# Patient Record
Sex: Male | Born: 1995 | Race: Black or African American | Hispanic: No | Marital: Single | Smoking: Current every day smoker
Health system: Southern US, Community
[De-identification: ages and names within clinical notes are randomized; demographics above are authoritative.]

## PROBLEM LIST (undated history)

## (undated) ENCOUNTER — Ambulatory Visit: Admission: EM | Payer: 59 | Source: Home / Self Care

## (undated) DIAGNOSIS — Z789 Other specified health status: Secondary | ICD-10-CM

## (undated) DIAGNOSIS — G4700 Insomnia, unspecified: Secondary | ICD-10-CM

## (undated) HISTORY — DX: Other specified health status: Z78.9

## (undated) HISTORY — PX: OTHER SURGICAL HISTORY: SHX169

---
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
## 2018-05-08 ENCOUNTER — Encounter (HOSPITAL_COMMUNITY): Payer: Self-pay

## 2018-05-08 ENCOUNTER — Emergency Department (HOSPITAL_COMMUNITY)
Admission: EM | Admit: 2018-05-08 | Discharge: 2018-05-08 | Disposition: A | Discharge status: Home / Self Care | Payer: Medicaid - Out of State | Attending: Emergency Medicine | Admitting: Emergency Medicine

## 2018-05-08 ENCOUNTER — Other Ambulatory Visit: Payer: Self-pay

## 2018-05-08 ENCOUNTER — Emergency Department (HOSPITAL_COMMUNITY): Payer: Medicaid - Out of State

## 2018-05-08 DIAGNOSIS — R1031 Right lower quadrant pain: Secondary | ICD-10-CM | POA: Diagnosis present

## 2018-05-08 DIAGNOSIS — E860 Dehydration: Secondary | ICD-10-CM | POA: Diagnosis not present

## 2018-05-08 DIAGNOSIS — R112 Nausea with vomiting, unspecified: Secondary | ICD-10-CM | POA: Diagnosis not present

## 2018-05-08 LAB — COMPREHENSIVE METABOLIC PANEL
ALT: 20 U/L (ref 0–44)
AST: 31 U/L (ref 15–41)
Albumin: 4.9 g/dL (ref 3.5–5.0)
Alkaline Phosphatase: 77 U/L (ref 38–126)
Anion gap: 14 (ref 5–15)
BUN: 22 mg/dL — ABNORMAL HIGH (ref 6–20)
CO2: 27 mmol/L (ref 22–32)
Calcium: 10 mg/dL (ref 8.9–10.3)
Chloride: 96 mmol/L — ABNORMAL LOW (ref 98–111)
Creatinine, Ser: 1.23 mg/dL (ref 0.61–1.24)
GFR calc Af Amer: >60 mL/min (ref >60)
GFR calc non Af Amer: >60 mL/min (ref >60)
Glucose, Bld: 113 mg/dL — ABNORMAL HIGH (ref 70–99)
Potassium: 3.1 mmol/L — ABNORMAL LOW (ref 3.5–5.1)
Sodium: 137 mmol/L (ref 135–145)
Total Bilirubin: 1.2 mg/dL (ref 0.3–1.2)
Total Protein: 8.1 g/dL (ref 6.5–8.1)

## 2018-05-08 LAB — CBC
HCT: 52 % (ref 39.0–52.0)
Hemoglobin: 17.7 g/dL — ABNORMAL HIGH (ref 13.0–17.0)
MCH: 30.3 pg (ref 26.0–34.0)
MCHC: 34 g/dL (ref 30.0–36.0)
MCV: 88.9 fL (ref 80.0–100.0)
Platelets: 243 10*3/uL (ref 150–400)
RBC: 5.85 MIL/uL — ABNORMAL HIGH (ref 4.22–5.81)
RDW: 12.4 % (ref 11.5–15.5)
WBC: 8.3 10*3/uL (ref 4.0–10.5)
nRBC: 0 % (ref 0.0–0.2)

## 2018-05-08 LAB — LIPASE, BLOOD: Lipase: 32 U/L (ref 11–51)

## 2018-05-08 MED ORDER — PROMETHAZINE HCL 25 MG PO TABS: 25.0000 mg | ORAL_TABLET | Freq: Four times a day (QID) | ORAL | 0 refills | Status: DC | PRN

## 2018-05-08 MED ORDER — IOHEXOL 300 MG/ML  SOLN
100.0000 mL | Freq: Once | INTRAMUSCULAR | Status: AC | PRN
  Administered 2018-05-08: 100 mL via INTRAVENOUS

## 2018-05-08 MED ORDER — MORPHINE SULFATE (PF) 4 MG/ML IV SOLN
4.0000 mg | Freq: Once | INTRAVENOUS | Status: AC
  Administered 2018-05-08: 4 mg via INTRAVENOUS
  Filled 2018-05-08: qty 1

## 2018-05-08 MED ORDER — METOCLOPRAMIDE HCL 5 MG/ML IJ SOLN
10.0000 mg | Freq: Once | INTRAMUSCULAR | Status: AC
  Administered 2018-05-08: 10 mg via INTRAVENOUS
  Filled 2018-05-08: qty 2

## 2018-05-08 MED ORDER — SODIUM CHLORIDE 0.9 % IV BOLUS
1000.0000 mL | Freq: Once | INTRAVENOUS | Status: AC
  Administered 2018-05-08: 1000 mL via INTRAVENOUS

## 2018-05-08 MED ORDER — POTASSIUM CHLORIDE CRYS ER 20 MEQ PO TBCR
40.0000 meq | EXTENDED_RELEASE_TABLET | Freq: Once | ORAL | Status: AC
  Administered 2018-05-08: 40 meq via ORAL
  Filled 2018-05-08: qty 2

## 2018-05-08 MED ORDER — DIPHENHYDRAMINE HCL 50 MG/ML IJ SOLN
25.0000 mg | Freq: Once | INTRAMUSCULAR | Status: AC
  Administered 2018-05-08: 25 mg via INTRAVENOUS
  Filled 2018-05-08: qty 1

## 2018-05-08 MED ORDER — SODIUM CHLORIDE 0.9% FLUSH
3.0000 mL | Freq: Once | INTRAVENOUS | Status: AC
  Administered 2018-05-08: 3 mL via INTRAVENOUS

## 2018-05-08 NOTE — ED Notes (Signed)
Patient transported to CT 

## 2018-05-08 NOTE — ED Notes (Signed)
Pt tolerated fluid challenge. No nausea noted

## 2018-05-08 NOTE — ED Triage Notes (Signed)
Pt here for nausea and emesis for the last 3 days.  Afebrile, no cough or chills.  A&Ox4.

## 2018-05-08 NOTE — ED Notes (Signed)
Pt back from CT

## 2018-05-08 NOTE — ED Notes (Signed)
Lavina Hamman (mother) : [phone]

## 2018-05-08 NOTE — ED Provider Notes (Signed)
[facility] EMERGENCY DEPARTMENT Provider Note   CSN: [id number] Arrival date & time: 05/08/18  1624    History   Chief Complaint Chief Complaint  Patient presents with  . Nausea  . Emesis    HPI Douglas Poole is a 23 y.o. male with no significant past medical history who presents today for evaluation of right lower quadrant abdominal pain, nausea, and vomiting for 4 days.  He denies any diarrhea, says that he had a bowel movement last 2 days ago however generally feels constipated.  He denies any fevers, coughs, chest pain, or shortness of breath.  He says that he has had a hard time sleeping recently and that he feels that is contributing.  He denies any recent trauma.  He has never had any abdominal surgeries before.  His pain has not radiated or moved. He reports he has vomited about 45 times today.     HPI  History reviewed. No pertinent past medical history.  Patient Active Problem List   Diagnosis Date Noted  . Acne 12/13/2012    Past Surgical History:  Procedure Laterality Date  . KNEE SURGERY  2014        Home Medications    Prior to Admission medications   Medication Sig Start Date End Date Taking? Authorizing Provider  PRESCRIPTION MEDICATION once.    [provider]  promethazine (PHENERGAN) 25 MG tablet Take 1 tablet (25 mg total) by mouth every 6 (six) hours as needed for nausea or vomiting. 05/08/18   Cristina Gong, PA-C    Family History History reviewed. No pertinent family history.  Social History Social History   Tobacco Use  . Smoking status: Never Smoker  . Smokeless tobacco: Never Used  Substance Use Topics  . Alcohol use: No  . Drug use: No     Allergies   Patient has no known allergies.   Review of Systems Review of Systems  Constitutional: Negative for chills and fever.  HENT: Negative for congestion.   Respiratory: Negative for chest tightness and shortness of breath.   Cardiovascular:  Negative for chest pain.  Gastrointestinal: Positive for abdominal pain, constipation, nausea and vomiting. Negative for abdominal distention, blood in stool and diarrhea.  Genitourinary: Negative for dysuria.  Musculoskeletal: Negative for back pain and neck pain.  Skin: Negative for color change and rash.  Neurological: Negative for weakness, light-headedness and headaches.  All other systems reviewed and are negative.    Physical Exam Updated Vital Signs BP 135/80   Pulse 74   Temp 98.9 F (37.2 C) (Oral)   Resp 16   SpO2 100%   Physical Exam Vitals signs and nursing note reviewed.  Constitutional:      Appearance: He is well-developed.     Comments: Actively vomiting on exam  HENT:     Head: Normocephalic and atraumatic.  Eyes:     Conjunctiva/sclera: Conjunctivae normal.  Neck:     Musculoskeletal: Normal range of motion and neck supple. No neck rigidity.  Cardiovascular:     Rate and Rhythm: Normal rate and regular rhythm.     Pulses: Normal pulses.     Heart sounds: Normal heart sounds. No murmur.  Pulmonary:     Effort: Pulmonary effort is normal. No respiratory distress.     Breath sounds: Normal breath sounds.  Abdominal:     General: Abdomen is flat. Bowel sounds are normal. There is no distension.     Palpations: Abdomen is soft.  Tenderness: There is abdominal tenderness. Positive signs include McBurney's sign. Negative signs include Murphy's sign.     Comments: Tenderness is worse in the right lower quadrant, is also present in right upper quadrant.  Musculoskeletal: Normal range of motion.     Right lower leg: No edema.     Left lower leg: No edema.  Skin:    General: Skin is warm and dry.  Neurological:     General: No focal deficit present.     Mental Status: He is alert and oriented to person, place, and time.  Psychiatric:        Mood and Affect: Mood normal.        Behavior: Behavior normal.      ED Treatments / Results  Labs (all labs  ordered are listed, but only abnormal results are displayed) Labs Reviewed  COMPREHENSIVE METABOLIC PANEL - Abnormal; Notable for the following components:      Result Value   Potassium 3.1 (*)    Chloride 96 (*)    Glucose, Bld 113 (*)    BUN 22 (*)    All other components within normal limits  CBC - Abnormal; Notable for the following components:   RBC 5.85 (*)    Hemoglobin 17.7 (*)    All other components within normal limits  LIPASE, BLOOD  URINALYSIS, ROUTINE W REFLEX MICROSCOPIC    EKG None  Radiology Ct Abdomen Pelvis W Contrast  Result Date: 05/08/2018 CLINICAL DATA:  Nausea and vomiting with abdominal pain EXAM: CT ABDOMEN AND PELVIS WITH CONTRAST TECHNIQUE: Multidetector CT imaging of the abdomen and pelvis was performed using the standard protocol following bolus administration of intravenous contrast. CONTRAST:  OMNIPAQUE IOHEXOL 300 MG/ML  SOLN COMPARISON:  None FINDINGS: Lower chest: Lung bases are clear. Hepatobiliary: There is focal fatty infiltration near the fissure for the ligamentum teres. No focal liver lesions are appreciable. Gallbladder wall is not appreciably thickened. There is no biliary duct dilatation. Pancreas: No pancreatic mass or inflammatory focus evident. Spleen: No splenic lesions are appreciable. Adrenals/Urinary Tract: Adrenals bilaterally appear normal. Kidneys bilaterally show no evident mass or hydronephrosis on either side. There is no renal or ureteral calculus on either side. Urinary bladder is midline with wall thickness within normal limits. Stomach/Bowel: There is no bowel wall or mesenteric thickening. There is no evident bowel obstruction. There is no free air or portal venous air. Vascular/Lymphatic: Aorta is nonaneurysmal. There is both a left and right inferior vena cava inferior to the renal veins. The left-sided inferior vena cava joins the left renal vein. The left and right renal veins insert into the right sided inferior vena  cava. The infrahepatic portion of the inferior vena cava appears normal. No obstructing lesions are evident. No adenopathy is evident in the abdomen or pelvis. Reproductive: Prostate and seminal vesicles appear normal in size and contour. No evident pelvic mass. Other: The appendix appears unremarkable. No abscess or ascites is evident in the abdomen or pelvis. Musculoskeletal: There are no blastic or lytic bone lesions. There is no intramuscular or abdominal wall lesion. IMPRESSION: 1. 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 2. Appendix appears normal. No bowel obstruction. No abscess in the abdomen or pelvis. 3, no evident renal or ureteral calculus. No hydronephrosis. Urinary bladder wall thickness within normal limits. Electronically Signed   By: Bretta Bang III M.D.   On: 05/08/2018 19:45    Procedures Procedures (including critical care time)  Medications Ordered in ED Medications  sodium chloride flush (NS) 0.9 % injection 3 mL (3 mLs Intravenous Given 05/08/18 1827)  sodium chloride 0.9 % bolus 1,000 mL (0 mLs Intravenous Stopped 05/08/18 1907)  metoCLOPramide (REGLAN) injection 10 mg (10 mg Intravenous Given 05/08/18 1824)  diphenhydrAMINE (BENADRYL) injection 25 mg (25 mg Intravenous Given 05/08/18 1825)  morphine 4 MG/ML injection 4 mg (4 mg Intravenous Given 05/08/18 1824)  iohexol (OMNIPAQUE) 300 MG/ML solution 100 mL (100 mLs Intravenous Contrast Given 05/08/18 1922)  potassium chloride SA (K-DUR,KLOR-CON) CR tablet 40 mEq (40 mEq Oral Given 05/08/18 1955)     Initial Impression / Assessment and Plan / ED Course  I have reviewed the triage vital signs and the nursing notes.  Pertinent labs & imaging results that were available during my care of the patient were reviewed by me and considered in my medical decision making (see chart for details).  Clinical Course as of May 08 2250  Mon May 08, 2018  2003 Patient reevaluated, he is  feeling much better, ready for PO challenge.    [EH]  2025 Patient reevaluated, he says that he "drank the water too fast" and is now feeling nauseous.   [EH]    Clinical Course User Index [EH] Cristina Gong, PA-C      Patient presents today for evaluation of right lower quadrant abdominal pain, nausea, and vomiting.  On exam his abdomen was tender to palpation in the right lower quadrant and he was actively vomiting.  Labs were obtained and reviewed, his hemoglobin is elevated at 17.7 consistent with dehydration.  His potassium was slightly low at 3.1, suspect that this is secondary to GI losses.  He denied any urinary symptoms, therefore UA was not obtained.  His lipase is not elevated, do not suspect pancreatitis.  CT scan abdomen pelvis was obtained without acute abnormalities or evidence of appendicitis.  He was informed of his anatomical variant inferior vena cava.  He felt much better after fluids, Reglan, Benadryl, and morphine.  He was able to p.o. challenge without additional vomiting.  Suspect gastroenteritis versus cannabinoid hyperemesis.  He does admit to frequent marijuana use.  He is advised to abstain from all marijuana use, recommended starting with a bland diet.  He is given a prescription for Phenergan as he has Zofran prescriptions and says that this does not work.    Given that I suspect his hypokalemia is from GI loss, I do not feel that prescription for continued potassium at home is indicated.  Return precautions were discussed with patient who states their understanding.  At the time of discharge patient denied any unaddressed complaints or concerns.  Patient is agreeable for discharge home.   Final Clinical Impressions(s) / ED Diagnoses   Final diagnoses:  Non-intractable vomiting with nausea, unspecified vomiting type  Dehydration    ED Discharge Orders         Ordered    promethazine (PHENERGAN) 25 MG tablet  Every 6 hours PRN     05/08/18 2131            Norman Clay 05/08/18 2254    Raeford Razor, MD 05/08/18 [phone]

## 2018-05-08 NOTE — Discharge Instructions (Addendum)
Today you received medications that may make you sleepy or impair your ability to make decisions.  For the next 24 hours please do not drive, operate heavy machinery, care for a small child with out another adult present, or perform any activities that may cause harm to you or someone else if you were to fall asleep or be impaired.   Today your blood work and CT scan were reassuring.  I suspect that your symptoms are caused either by a virus or by your marijuana use.  I would recommend that you stop using marijuana for at least 3 weeks to see if your symptoms improve.

## 2018-05-08 NOTE — ED Notes (Signed)
Patient verbalizes understanding of discharge instructions. Opportunity for questioning and answers were provided. Armband removed by staff, pt discharged from ED ambulatory.   

## 2018-05-08 NOTE — ED Notes (Signed)
Patient reports 4 days of vomiting and abdominal pain

## 2018-05-08 NOTE — ED Notes (Signed)
Pt transported to CT ?

## 2019-03-27 ENCOUNTER — Encounter: Payer: Self-pay | Admitting: Emergency Medicine

## 2019-03-27 ENCOUNTER — Ambulatory Visit
Admission: EM | Admit: 2019-03-27 | Discharge: 2019-03-27 | Disposition: A | Discharge status: Home / Self Care | Payer: 59 | Attending: Emergency Medicine | Admitting: Emergency Medicine

## 2019-03-27 ENCOUNTER — Other Ambulatory Visit: Payer: Self-pay

## 2019-03-27 DIAGNOSIS — H00012 Hordeolum externum right lower eyelid: Secondary | ICD-10-CM | POA: Diagnosis not present

## 2019-03-27 DIAGNOSIS — H00011 Hordeolum externum right upper eyelid: Secondary | ICD-10-CM | POA: Diagnosis not present

## 2019-03-27 MED ORDER — POLYMYXIN B-TRIMETHOPRIM 10000-0.1 UNIT/ML-% OP SOLN: 1.0000 [drp] | OPHTHALMIC | 0 refills | Status: AC

## 2019-03-27 NOTE — ED Provider Notes (Signed)
[facility]    CSN: [id number] Arrival date & time: 03/27/19  1331      History   Chief Complaint Chief Complaint  Patient presents with  . Eye Pain    HPI Douglas Poole is a 24 y.o. male presenting for right eye pain, swelling, irritation for the last week.  Patient states that he developed a stye on his right upper lid a week ago, another 1 is lower lid 2 days ago.  States he got dirt in his eye on the way in here, there was able to irrigate it with tap water.  Patient denies change in vision contacts or glasses use, fever, discharge, tearing.  Tried using ice packs, hot compress last night without significant relief.  Denies immunocompromise status.  History reviewed. No pertinent past medical history.  Patient Active Problem List   Diagnosis Date Noted  . Acne 12/13/2012    Past Surgical History:  Procedure Laterality Date  . KNEE SURGERY  2014       Home Medications    Prior to Admission medications   Medication Sig Start Date End Date Taking? Authorizing Provider  PRESCRIPTION MEDICATION once.    [provider]  promethazine (PHENERGAN) 25 MG tablet Take 1 tablet (25 mg total) by mouth every 6 (six) hours as needed for nausea or vomiting. 05/08/18   Lorin Glass, PA-C  trimethoprim-polymyxin b (POLYTRIM) ophthalmic solution Place 1 drop into the right eye every 4 (four) hours for 7 days. 03/27/19 04/03/19  Hall-Potvin, [place], PA-C    Family History History reviewed. No pertinent family history.  Social History Social History   Tobacco Use  . Smoking status: Never Smoker  . Smokeless tobacco: Never Used  Substance Use Topics  . Alcohol use: No  . Drug use: No     Allergies   Patient has no known allergies.   Review of Systems As per HPI   Physical Exam Triage Vital Signs ED Triage Vitals  Enc Vitals Group     BP      Pulse      Resp      Temp      Temp src      SpO2      Weight      Height      Head  Circumference      Peak Flow      Pain Score      Pain Loc      Pain Edu?      Excl. in [place]?    No data found.  Updated Vital Signs BP 134/81 (BP Location: Left Arm)   Pulse 77   Temp 98.5 F (36.9 C) (Temporal)   Resp 16   SpO2 97%   Visual Acuity Right Eye Distance:   Left Eye Distance:   Bilateral Distance:    Right Eye Near: R Near: 20/25 Left Eye Near:  L Near: 20/20 Bilateral Near:  20/20  Physical Exam Constitutional:      General: He is not in acute distress.    Appearance: Normal appearance. He is not ill-appearing.  HENT:     Head: Normocephalic and atraumatic.  Eyes:     General: Lids are normal. Lids are everted, no foreign bodies appreciated. Vision grossly intact. Gaze aligned appropriately. No visual field deficit or scleral icterus.       Right eye: Hordeolum present. No foreign body or discharge.        Left eye: No foreign  body, discharge or hordeolum.     Extraocular Movements: Extraocular movements intact.     Right eye: No nystagmus.     Left eye: No nystagmus.     Conjunctiva/sclera:     Right eye: Right conjunctiva is not injected. No chemosis, exudate or hemorrhage.    Left eye: Left conjunctiva is not injected. No chemosis, exudate or hemorrhage.    Pupils: Pupils are equal, round, and reactive to light.   Cardiovascular:     Rate and Rhythm: Normal rate.  Pulmonary:     Effort: Pulmonary effort is normal.     Breath sounds: No wheezing.  Musculoskeletal:     Cervical back: Neck supple. No tenderness.  Lymphadenopathy:     Cervical: No cervical adenopathy.  Skin:    Findings: No bruising, erythema or rash.      UC Treatments / Results  Labs (all labs ordered are listed, but only abnormal results are displayed) Labs Reviewed - No data to display  EKG   Radiology No results found.  Procedures Procedures (including critical care time)  Medications Ordered in UC Medications - No data to display  Initial Impression /  Assessment and Plan / UC Course  I have reviewed the triage vital signs and the nursing notes.  Pertinent labs & imaging results that were available during my care of the patient were reviewed by me and considered in my medical decision making (see chart for details).     Patient afebrile, without severe orbital pain, or change in vision.  No corneal abrasion identified on exam.  Will start Polytrim drops, hot compresses, follow-up with ophthalmology in 1 week.  Return precautions discussed, patient verbalized understanding and is agreeable to plan. Final Clinical Impressions(s) / UC Diagnoses   Final diagnoses:  Hordeolum externum of right upper eyelid  Hordeolum externum of right lower eyelid     Discharge Instructions     Use antibiotic eyedrops as directed. Use hot compress for 5-10 minutes, 3-5 times a day. Follow up w/ eye doc in 1 week: sooner if change in vision, eye pain, fever.    ED Prescriptions    Medication Sig Dispense Auth. Provider   trimethoprim-polymyxin b (POLYTRIM) ophthalmic solution Place 1 drop into the right eye every 4 (four) hours for 7 days. 10 mL Hall-Potvin, [place], PA-C     PDMP not reviewed this encounter.   Hall-Potvin, [place], [place] 03/27/19 1853

## 2019-03-27 NOTE — ED Triage Notes (Signed)
Pt presents to [facility] for assessment of two styes to right eye and then today got dirt in the right eye.  Redness, swelling and discomfort noted.

## 2019-03-27 NOTE — Discharge Instructions (Addendum)
Use antibiotic eyedrops as directed. Use hot compress for 5-10 minutes, 3-5 times a day. Follow up w/ eye doc in 1 week: sooner if change in vision, eye pain, fever.

## 2019-06-14 ENCOUNTER — Emergency Department (HOSPITAL_COMMUNITY)
Admission: EM | Admit: 2019-06-14 | Discharge: 2019-06-14 | Disposition: A | Discharge status: Home / Self Care | Payer: 59 | Attending: Emergency Medicine | Admitting: Emergency Medicine

## 2019-06-14 ENCOUNTER — Encounter (HOSPITAL_COMMUNITY): Payer: Self-pay | Admitting: Emergency Medicine

## 2019-06-14 ENCOUNTER — Other Ambulatory Visit: Payer: Self-pay

## 2019-06-14 DIAGNOSIS — R197 Diarrhea, unspecified: Secondary | ICD-10-CM | POA: Insufficient documentation

## 2019-06-14 DIAGNOSIS — R10817 Generalized abdominal tenderness: Secondary | ICD-10-CM | POA: Insufficient documentation

## 2019-06-14 DIAGNOSIS — K920 Hematemesis: Secondary | ICD-10-CM | POA: Diagnosis not present

## 2019-06-14 DIAGNOSIS — R1013 Epigastric pain: Secondary | ICD-10-CM | POA: Insufficient documentation

## 2019-06-14 DIAGNOSIS — R110 Nausea: Secondary | ICD-10-CM | POA: Insufficient documentation

## 2019-06-14 LAB — CBC
HCT: 48.2 % (ref 39.0–52.0)
Hemoglobin: 16.5 g/dL (ref 13.0–17.0)
MCH: 30.9 pg (ref 26.0–34.0)
MCHC: 34.2 g/dL (ref 30.0–36.0)
MCV: 90.3 fL (ref 80.0–100.0)
Platelets: 308 10*3/uL (ref 150–400)
RBC: 5.34 MIL/uL (ref 4.22–5.81)
RDW: 12.5 % (ref 11.5–15.5)
WBC: 7.8 10*3/uL (ref 4.0–10.5)
nRBC: 0 % (ref 0.0–0.2)

## 2019-06-14 LAB — COMPREHENSIVE METABOLIC PANEL
ALT: 25 U/L (ref 0–44)
AST: 37 U/L (ref 15–41)
Albumin: 4.4 g/dL (ref 3.5–5.0)
Alkaline Phosphatase: 86 U/L (ref 38–126)
Anion gap: 10 (ref 5–15)
BUN: 17 mg/dL (ref 6–20)
CO2: 27 mmol/L (ref 22–32)
Calcium: 9.5 mg/dL (ref 8.9–10.3)
Chloride: 104 mmol/L (ref 98–111)
Creatinine, Ser: 1.12 mg/dL (ref 0.61–1.24)
GFR calc Af Amer: >60 mL/min (ref >60)
GFR calc non Af Amer: >60 mL/min (ref >60)
Glucose, Bld: 108 mg/dL — ABNORMAL HIGH (ref 70–99)
Potassium: 3.4 mmol/L — ABNORMAL LOW (ref 3.5–5.1)
Sodium: 141 mmol/L (ref 135–145)
Total Bilirubin: 1.1 mg/dL (ref 0.3–1.2)
Total Protein: 7.8 g/dL (ref 6.5–8.1)

## 2019-06-14 LAB — LIPASE, BLOOD: Lipase: 25 U/L (ref 11–51)

## 2019-06-14 MED ORDER — ONDANSETRON HCL 4 MG/2ML IJ SOLN
4.0000 mg | Freq: Once | INTRAMUSCULAR | Status: AC
  Administered 2019-06-14: 4 mg via INTRAVENOUS
  Filled 2019-06-14: qty 2

## 2019-06-14 MED ORDER — ALUM & MAG HYDROXIDE-SIMETH 200-200-20 MG/5ML PO SUSP
30.0000 mL | Freq: Once | ORAL | Status: AC
  Administered 2019-06-14: 30 mL via ORAL
  Filled 2019-06-14: qty 30

## 2019-06-14 MED ORDER — LIDOCAINE VISCOUS HCL 2 % MT SOLN
15.0000 mL | Freq: Once | OROMUCOSAL | Status: AC
  Administered 2019-06-14: 16:00:00 15 mL via ORAL
  Filled 2019-06-14: qty 15

## 2019-06-14 MED ORDER — FAMOTIDINE IN NACL 20-0.9 MG/50ML-% IV SOLN
20.0000 mg | Freq: Once | INTRAVENOUS | Status: AC
  Administered 2019-06-14: 20 mg via INTRAVENOUS
  Filled 2019-06-14: qty 50

## 2019-06-14 MED ORDER — LORAZEPAM 2 MG/ML IJ SOLN
1.0000 mg | Freq: Once | INTRAMUSCULAR | Status: AC
  Administered 2019-06-14: 1 mg via INTRAVENOUS
  Filled 2019-06-14: qty 1

## 2019-06-14 MED ORDER — MORPHINE SULFATE (PF) 4 MG/ML IV SOLN
4.0000 mg | Freq: Once | INTRAVENOUS | Status: AC
  Administered 2019-06-14: 4 mg via INTRAVENOUS
  Filled 2019-06-14: qty 1

## 2019-06-14 MED ORDER — PANTOPRAZOLE SODIUM 20 MG PO TBEC: 20.0000 mg | DELAYED_RELEASE_TABLET | Freq: Every day | ORAL | 0 refills | Status: DC

## 2019-06-14 MED ORDER — LACTATED RINGERS IV BOLUS
1000.0000 mL | Freq: Once | INTRAVENOUS | Status: AC
  Administered 2019-06-14: 1000 mL via INTRAVENOUS

## 2019-06-14 NOTE — Discharge Instructions (Signed)
Please avoid eating anything heavy, spicy, or fatty until you symptoms are improving Take Zofran as needed for nausea/vomiting Take Protonix once daily for acid suppression Avoid alcohol and NSAIDs (Ibuprofen or Naproxen) Follow up with GI doctor

## 2019-06-14 NOTE — ED Provider Notes (Signed)
[facility] EMERGENCY DEPARTMENT Provider Note   CSN: [id number] Arrival date & time: 06/14/19  1155     History Chief Complaint  Patient presents with  . Emesis  . Abdominal Pain    Douglas Poole is a 24 y.o. male with history of acne who presents with abdominal pain, N/V since Monday. He drank heavily on Sunday and the next day started to have abdominal pain with multiple episodes of vomiting. Emesis was initially food contents and became coffee ground emesis. He reports up to 10 episodes daily. Abdominal pain is epigastric and periumbilical and radiates to the left side of the abdomen. He reports associated non-bloody diarrhea. He has had similar symptoms in the past with heavy ETOH use but has never had hematemesis. He tried Pepcid with mild, temporarily relief. He denies fever, syncope, chest pain, SOB, urinary symptoms. He does not take NSAIDs. He was on Accutane but has stopped this med last month. He has only been able to tolerate ice.  HPI     History reviewed. No pertinent past medical history.  Patient Active Problem List   Diagnosis Date Noted  . Acne 12/13/2012    Past Surgical History:  Procedure Laterality Date  . KNEE SURGERY  2014       No family history on file.  Social History   Tobacco Use  . Smoking status: Never Smoker  . Smokeless tobacco: Never Used  Substance Use Topics  . Alcohol use: No  . Drug use: No    Home Medications Prior to Admission medications   Medication Sig Start Date End Date Taking? Authorizing Provider  PRESCRIPTION MEDICATION once.    [provider]  promethazine (PHENERGAN) 25 MG tablet Take 1 tablet (25 mg total) by mouth every 6 (six) hours as needed for nausea or vomiting. 05/08/18   Cristina Gong, PA-C    Allergies    Patient has no known allergies.  Review of Systems   Review of Systems  Constitutional: Negative for chills and fever.  Respiratory: Negative for shortness  of breath.   Cardiovascular: Negative for chest pain.  Gastrointestinal: Positive for abdominal pain, diarrhea, nausea and vomiting. Negative for blood in stool.       +coffee ground emesis  Genitourinary: Negative for dysuria and flank pain.  All other systems reviewed and are negative.   Physical Exam Updated Vital Signs BP (!) 106/95 (BP Location: Left Arm)   Pulse 83   Temp 98.2 F (36.8 C) (Oral)   Resp 14   Ht 6\' 2"  (1.88 m)   SpO2 96%   Physical Exam Vitals and nursing note reviewed.  Constitutional:      General: He is not in acute distress.    Appearance: Normal appearance. He is well-developed. He is not ill-appearing.     Comments: Calm and cooperative. NAD  HENT:     Head: Normocephalic and atraumatic.  Eyes:     General: No scleral icterus.       Right eye: No discharge.        Left eye: No discharge.     Conjunctiva/sclera: Conjunctivae normal.     Pupils: Pupils are equal, round, and reactive to light.  Cardiovascular:     Rate and Rhythm: Normal rate and regular rhythm.  Pulmonary:     Effort: Pulmonary effort is normal. No respiratory distress.     Breath sounds: Normal breath sounds.  Abdominal:     General: There is no distension.  Palpations: Abdomen is soft.     Tenderness: There is abdominal tenderness (generalized, worse in the epigastric area).  Musculoskeletal:     Cervical back: Normal range of motion.  Skin:    General: Skin is warm and dry.  Neurological:     Mental Status: He is alert and oriented to person, place, and time.  Psychiatric:        Behavior: Behavior normal.     ED Results / Procedures / Treatments   Labs (all labs ordered are listed, but only abnormal results are displayed) Labs Reviewed  COMPREHENSIVE METABOLIC PANEL - Abnormal; Notable for the following components:      Result Value   Potassium 3.4 (*)    Glucose, Bld 108 (*)    All other components within normal limits  LIPASE, BLOOD  CBC     EKG None  Radiology No results found.  Procedures Procedures (including critical care time)  Medications Ordered in ED Medications  lactated ringers bolus 1,000 mL (0 mLs Intravenous Stopped 06/14/19 1619)  ondansetron (ZOFRAN) injection 4 mg (4 mg Intravenous Given 06/14/19 1441)  famotidine (PEPCID) IVPB 20 mg premix (0 mg Intravenous Stopped 06/14/19 1619)  morphine 4 MG/ML injection 4 mg (4 mg Intravenous Given 06/14/19 1622)  alum & mag hydroxide-simeth (MAALOX/MYLANTA) 200-200-20 MG/5ML suspension 30 mL (30 mLs Oral Given 06/14/19 1623)    And  lidocaine (XYLOCAINE) 2 % viscous mouth solution 15 mL (15 mLs Oral Given 06/14/19 1623)  LORazepam (ATIVAN) injection 1 mg (1 mg Intravenous Given 06/14/19 1619)    ED Course  I have reviewed the triage vital signs and the nursing notes.  Pertinent labs & imaging results that were available during my care of the patient were reviewed by me and considered in my medical decision making (see chart for details).  24 year old male presents with N/V and upper abdominal pain after drinking heavily. He reports hematemesis as well. He did vomit while in the ED and it appeared to be mostly bilious with a few black specks. His vitals are normal. He is well appearing. His labs were reassuring. Imaging not indicated today due to non-tender abdomen, normal labs and vitals, and well appearance. He was given symptomatic control and tolerated PO. Rx for Zofran and Protonix given and he was given GI f/u.  MDM Rules/Calculators/A&P                       Final Clinical Impression(s) / ED Diagnoses Final diagnoses:  Epigastric pain  Hematemesis with nausea    Rx / DC Orders ED Discharge Orders    None       Recardo Evangelist, PA-C 06/15/19 1229    Pattricia Boss, MD 06/15/19 [phone]

## 2019-06-14 NOTE — ED Triage Notes (Signed)
Pt reports drinking alcohol on Sunday and has been throwing up blood since Monday. Endorses mid abd pain.

## 2019-11-08 ENCOUNTER — Encounter (HOSPITAL_COMMUNITY): Payer: Self-pay | Admitting: Emergency Medicine

## 2019-11-08 ENCOUNTER — Emergency Department (HOSPITAL_COMMUNITY)
Admission: EM | Admit: 2019-11-08 | Discharge: 2019-11-09 | Disposition: A | Discharge status: Home / Self Care | Payer: Medicaid - Out of State | Attending: Emergency Medicine | Admitting: Emergency Medicine

## 2019-11-08 ENCOUNTER — Other Ambulatory Visit: Payer: Self-pay

## 2019-11-08 DIAGNOSIS — U071 COVID-19: Secondary | ICD-10-CM | POA: Insufficient documentation

## 2019-11-08 DIAGNOSIS — Z5321 Procedure and treatment not carried out due to patient leaving prior to being seen by health care provider: Secondary | ICD-10-CM | POA: Diagnosis not present

## 2019-11-08 DIAGNOSIS — R0602 Shortness of breath: Secondary | ICD-10-CM | POA: Diagnosis present

## 2019-11-08 LAB — CBC
HCT: 48.7 % (ref 39.0–52.0)
Hemoglobin: 16.9 g/dL (ref 13.0–17.0)
MCH: 31.5 pg (ref 26.0–34.0)
MCHC: 34.7 g/dL (ref 30.0–36.0)
MCV: 90.9 fL (ref 80.0–100.0)
Platelets: 254 10*3/uL (ref 150–400)
RBC: 5.36 MIL/uL (ref 4.22–5.81)
RDW: 12.7 % (ref 11.5–15.5)
WBC: 8.4 10*3/uL (ref 4.0–10.5)
nRBC: 0 % (ref 0.0–0.2)

## 2019-11-08 LAB — COMPREHENSIVE METABOLIC PANEL
ALT: 18 U/L (ref 0–44)
AST: 29 U/L (ref 15–41)
Albumin: 5 g/dL (ref 3.5–5.0)
Alkaline Phosphatase: 84 U/L (ref 38–126)
Anion gap: 18 — ABNORMAL HIGH (ref 5–15)
BUN: 15 mg/dL (ref 6–20)
CO2: 21 mmol/L — ABNORMAL LOW (ref 22–32)
Calcium: 10.3 mg/dL (ref 8.9–10.3)
Chloride: 102 mmol/L (ref 98–111)
Creatinine, Ser: 1.13 mg/dL (ref 0.61–1.24)
GFR calc Af Amer: >60 mL/min (ref >60)
GFR calc non Af Amer: >60 mL/min (ref >60)
Glucose, Bld: 129 mg/dL — ABNORMAL HIGH (ref 70–99)
Potassium: 3.5 mmol/L (ref 3.5–5.1)
Sodium: 141 mmol/L (ref 135–145)
Total Bilirubin: 0.8 mg/dL (ref 0.3–1.2)
Total Protein: 8.2 g/dL — ABNORMAL HIGH (ref 6.5–8.1)

## 2019-11-08 LAB — LIPASE, BLOOD: Lipase: 23 U/L (ref 11–51)

## 2019-11-08 MED ORDER — ONDANSETRON 4 MG PO TBDP
4.0000 mg | ORAL_TABLET | Freq: Once | ORAL | Status: AC | PRN
  Administered 2019-11-08: 4 mg via ORAL
  Filled 2019-11-08: qty 1

## 2019-11-08 NOTE — ED Triage Notes (Signed)
Pt reports N/V/D, SOB, weakness and fatigue X2 days.

## 2019-11-09 ENCOUNTER — Telehealth (HOSPITAL_COMMUNITY): Payer: Self-pay

## 2019-11-09 LAB — RESPIRATORY PANEL BY RT PCR (FLU A&B, COVID)
Influenza A by PCR: NEGATIVE
Influenza B by PCR: NEGATIVE
SARS Coronavirus 2 by RT PCR: POSITIVE — AB

## 2019-11-09 NOTE — ED Notes (Signed)
Pt leaving AMA. Advised to return if symptoms worsen. 

## 2019-11-10 ENCOUNTER — Other Ambulatory Visit (HOSPITAL_COMMUNITY): Payer: Self-pay | Admitting: Adult Health

## 2019-11-10 ENCOUNTER — Encounter (HOSPITAL_COMMUNITY): Payer: Self-pay | Admitting: Emergency Medicine

## 2019-11-10 ENCOUNTER — Emergency Department (HOSPITAL_COMMUNITY)
Admission: EM | Admit: 2019-11-10 | Discharge: 2019-11-10 | Disposition: A | Discharge status: Home / Self Care | Payer: HRSA Program | Attending: Emergency Medicine | Admitting: Emergency Medicine

## 2019-11-10 DIAGNOSIS — E860 Dehydration: Secondary | ICD-10-CM | POA: Diagnosis not present

## 2019-11-10 DIAGNOSIS — U071 COVID-19: Secondary | ICD-10-CM | POA: Diagnosis not present

## 2019-11-10 DIAGNOSIS — R112 Nausea with vomiting, unspecified: Secondary | ICD-10-CM | POA: Diagnosis present

## 2019-11-10 LAB — COMPREHENSIVE METABOLIC PANEL
ALT: 22 U/L (ref 0–44)
AST: 46 U/L — ABNORMAL HIGH (ref 15–41)
Albumin: 5.3 g/dL — ABNORMAL HIGH (ref 3.5–5.0)
Alkaline Phosphatase: 76 U/L (ref 38–126)
Anion gap: 20 — ABNORMAL HIGH (ref 5–15)
BUN: 42 mg/dL — ABNORMAL HIGH (ref 6–20)
CO2: 28 mmol/L (ref 22–32)
Calcium: 10.2 mg/dL (ref 8.9–10.3)
Chloride: 93 mmol/L — ABNORMAL LOW (ref 98–111)
Creatinine, Ser: 1.75 mg/dL — ABNORMAL HIGH (ref 0.61–1.24)
GFR calc Af Amer: >60 mL/min (ref >60)
GFR calc non Af Amer: 54 mL/min — ABNORMAL LOW (ref >60)
Glucose, Bld: 124 mg/dL — ABNORMAL HIGH (ref 70–99)
Potassium: 3.4 mmol/L — ABNORMAL LOW (ref 3.5–5.1)
Sodium: 141 mmol/L (ref 135–145)
Total Bilirubin: 0.8 mg/dL (ref 0.3–1.2)
Total Protein: 8.7 g/dL — ABNORMAL HIGH (ref 6.5–8.1)

## 2019-11-10 LAB — CBC WITH DIFFERENTIAL/PLATELET
Abs Immature Granulocytes: 0.02 10*3/uL (ref 0.00–0.07)
Basophils Absolute: 0 10*3/uL (ref 0.0–0.1)
Basophils Relative: 0 %
Eosinophils Absolute: 0 10*3/uL (ref 0.0–0.5)
Eosinophils Relative: 0 %
HCT: 55.2 % — ABNORMAL HIGH (ref 39.0–52.0)
Hemoglobin: 19 g/dL — ABNORMAL HIGH (ref 13.0–17.0)
Immature Granulocytes: 0 %
Lymphocytes Relative: 12 %
Lymphs Abs: 0.9 10*3/uL (ref 0.7–4.0)
MCH: 31.3 pg (ref 26.0–34.0)
MCHC: 34.4 g/dL (ref 30.0–36.0)
MCV: 90.8 fL (ref 80.0–100.0)
Monocytes Absolute: 1.3 10*3/uL — ABNORMAL HIGH (ref 0.1–1.0)
Monocytes Relative: 16 %
Neutro Abs: 5.8 10*3/uL (ref 1.7–7.7)
Neutrophils Relative %: 72 %
Platelets: 249 10*3/uL (ref 150–400)
RBC: 6.08 MIL/uL — ABNORMAL HIGH (ref 4.22–5.81)
RDW: 12.9 % (ref 11.5–15.5)
WBC: 8 10*3/uL (ref 4.0–10.5)
nRBC: 0 % (ref 0.0–0.2)

## 2019-11-10 MED ORDER — ONDANSETRON 4 MG PO TBDP: 4.0000 mg | ORAL_TABLET | Freq: Three times a day (TID) | ORAL | 0 refills | Status: DC | PRN

## 2019-11-10 MED ORDER — SODIUM CHLORIDE 0.9 % IV BOLUS
1000.0000 mL | Freq: Once | INTRAVENOUS | Status: AC
  Administered 2019-11-10: 1000 mL via INTRAVENOUS

## 2019-11-10 MED ORDER — ONDANSETRON 4 MG PO TBDP
4.0000 mg | ORAL_TABLET | Freq: Once | ORAL | Status: AC
  Administered 2019-11-10: 4 mg via ORAL
  Filled 2019-11-10: qty 1

## 2019-11-10 MED ORDER — PROMETHAZINE HCL 25 MG/ML IJ SOLN
12.5000 mg | Freq: Once | INTRAMUSCULAR | Status: AC
  Administered 2019-11-10: 12.5 mg via INTRAVENOUS
  Filled 2019-11-10: qty 1

## 2019-11-10 MED ORDER — ACETAMINOPHEN 500 MG PO TABS
1000.0000 mg | ORAL_TABLET | Freq: Once | ORAL | Status: AC
  Administered 2019-11-10: 1000 mg via ORAL
  Filled 2019-11-10: qty 2

## 2019-11-10 MED ORDER — FENTANYL CITRATE (PF) 100 MCG/2ML IJ SOLN
50.0000 ug | Freq: Once | INTRAMUSCULAR | Status: AC
  Administered 2019-11-10: 50 ug via INTRAVENOUS
  Filled 2019-11-10: qty 2

## 2019-11-10 NOTE — ED Notes (Signed)
Pt given gingerale for PO challenge 

## 2019-11-10 NOTE — Progress Notes (Signed)
I connected by phone with Bosie Helper on 11/10/2019 at 3:03 PM to discuss the potential use of a new treatment for mild to moderate COVID-19 viral infection in non-hospitalized patients.  This patient is a 24 y.o. male that meets the FDA criteria for Emergency Use Authorization of COVID monoclonal antibody casirivimab/imdevimab or bamlanivimab/eteseviamb.  Has a (+) direct SARS-CoV-2 viral test result  Has mild or moderate COVID-19   Is NOT hospitalized due to COVID-19  Is within 10 days of symptom onset  Has at least one of the high risk factor(s) for progression to severe COVID-19 and/or hospitalization as defined in EUA.  Specific high risk criteria : Other high risk medical condition per CDC:  social risk/demographic   I have spoken and communicated the following to the patient or parent/caregiver regarding COVID monoclonal antibody treatment:  1. FDA has authorized the emergency use for the treatment of mild to moderate COVID-19 in adults and pediatric patients with positive results of direct SARS-CoV-2 viral testing who are 9 years of age and older weighing at least 40 kg, and who are at high risk for progressing to severe COVID-19 and/or hospitalization.  2. The significant known and potential risks and benefits of COVID monoclonal antibody, and the extent to which such potential risks and benefits are unknown.  3. Information on available alternative treatments and the risks and benefits of those alternatives, including clinical trials.  4. Patients treated with COVID monoclonal antibody should continue to self-isolate and use infection control measures (e.g., wear mask, isolate, social distance, avoid sharing personal items, clean and disinfect "high touch" surfaces, and frequent handwashing) according to CDC guidelines.   5. The patient or parent/caregiver has the option to accept or refuse COVID monoclonal antibody treatment.  After reviewing this information with the  patient, the patient has agreed to receive one of the available covid 19 monoclonal antibodies and will be provided an appropriate fact sheet prior to infusion. Noreene Filbert, NP 11/10/2019 3:03 PM

## 2019-11-10 NOTE — ED Triage Notes (Signed)
Pt. Stated, I started not being able to sleep 4 day ago with a headache with N/V for 4 days

## 2019-11-10 NOTE — ED Triage Notes (Signed)
Pt. Here 2 days ago for the same and did not wait. Had labs done.

## 2019-11-10 NOTE — ED Provider Notes (Signed)
[facility] EMERGENCY DEPARTMENT Provider Note   CSN: [id number] Arrival date & time: 11/10/19  2841     History Chief Complaint  Patient presents with  . Insomnia  . Headache  . Nausea  . Emesis    Douglas Poole is a 24 y.o. male.  I presents to ER with concern for nausea, vomiting, headache.  Patient reports that he has had difficulty sleeping over the past few days, generalized fatigue and nausea.  Over the last day he has had approximately 6 episodes of nonbloody nonbilious emesis.  Has had some generalized abdominal discomfort.  Took some Motrin at home with minimal relief.  Has felt feverish.  Has not checked temperature.  Patient presented to ER on 9/23, basic labs were drawn, Covid test sent.  Patient left without being seen.  Covid test was positive.  Patient unaware of result.  Per review, someone tried contacting him with no answer.  HPI     History reviewed. No pertinent past medical history.  Patient Active Problem List   Diagnosis Date Noted  . Acne 12/13/2012    Past Surgical History:  Procedure Laterality Date  . KNEE SURGERY  2014       No family history on file.  Social History   Tobacco Use  . Smoking status: Never Smoker  . Smokeless tobacco: Never Used  Vaping Use  . Vaping Use: Every day  Substance Use Topics  . Alcohol use: No  . Drug use: No    Home Medications Prior to Admission medications   Medication Sig Start Date End Date Taking? Authorizing Provider  pantoprazole (PROTONIX) 20 MG tablet Take 1 tablet (20 mg total) by mouth daily. 06/14/19   Bethel Born, PA-C  PRESCRIPTION MEDICATION once.    [provider]  promethazine (PHENERGAN) 25 MG tablet Take 1 tablet (25 mg total) by mouth every 6 (six) hours as needed for nausea or vomiting. 05/08/18   Cristina Gong, PA-C    Allergies    Patient has no known allergies.  Review of Systems   Review of Systems  Constitutional: Positive  for chills, fatigue and fever.  HENT: Negative for ear pain and sore throat.   Eyes: Negative for pain and visual disturbance.  Respiratory: Negative for cough and shortness of breath.   Cardiovascular: Negative for chest pain and palpitations.  Gastrointestinal: Positive for abdominal pain, nausea and vomiting.  Genitourinary: Negative for dysuria and hematuria.  Musculoskeletal: Negative for arthralgias and back pain.  Skin: Negative for color change and rash.  Neurological: Negative for seizures and syncope.  All other systems reviewed and are negative.   Physical Exam Updated Vital Signs BP (!) 144/104   Pulse 97   Temp 99.3 F (37.4 C) (Oral)   Resp 18   Ht 6\' 2"  (1.88 m)   Wt 72.6 kg   SpO2 97%   BMI 20.54 kg/m   Physical Exam Vitals and nursing note reviewed.  Constitutional:      Appearance: He is well-developed.  HENT:     Head: Normocephalic and atraumatic.  Eyes:     Conjunctiva/sclera: Conjunctivae normal.  Cardiovascular:     Rate and Rhythm: Normal rate and regular rhythm.     Heart sounds: No murmur heard.   Pulmonary:     Effort: Pulmonary effort is normal. No respiratory distress.     Breath sounds: Normal breath sounds.  Abdominal:     Palpations: Abdomen is soft.  Tenderness: There is no abdominal tenderness.     Comments: Generalized tenderness, no focal tenderness  Musculoskeletal:     Cervical back: Neck supple.  Skin:    General: Skin is warm and dry.  Neurological:     Mental Status: He is alert.     ED Results / Procedures / Treatments   Labs (all labs ordered are listed, but only abnormal results are displayed) Labs Reviewed  COMPREHENSIVE METABOLIC PANEL - Abnormal; Notable for the following components:      Result Value   Potassium 3.4 (*)    Chloride 93 (*)    Glucose, Bld 124 (*)    BUN 42 (*)    Creatinine, Ser 1.75 (*)    Total Protein 8.7 (*)    Albumin 5.3 (*)    AST 46 (*)    GFR calc non Af Amer 54 (*)     Anion gap 20 (*)    All other components within normal limits  CBC WITH DIFFERENTIAL/PLATELET - Abnormal; Notable for the following components:   RBC 6.08 (*)    Hemoglobin 19.0 (*)    HCT 55.2 (*)    Monocytes Absolute 1.3 (*)    All other components within normal limits    EKG None  Radiology No results found.  Procedures Procedures (including critical care time)  Medications Ordered in ED Medications  sodium chloride 0.9 % bolus 1,000 mL (has no administration in time range)  ondansetron (ZOFRAN-ODT) disintegrating tablet 4 mg (4 mg Oral Given 11/10/19 0728)  sodium chloride 0.9 % bolus 1,000 mL (1,000 mLs Intravenous New Bag/Given (Non-Interop) 11/10/19 1046)  promethazine (PHENERGAN) injection 12.5 mg (12.5 mg Intravenous Given 11/10/19 1047)  acetaminophen (TYLENOL) tablet 1,000 mg (1,000 mg Oral Given 11/10/19 1050)  fentaNYL (SUBLIMAZE) injection 50 mcg (50 mcg Intravenous Given 11/10/19 1047)    ED Course  I have reviewed the triage vital signs and the nursing notes.  Pertinent labs & imaging results that were available during my care of the patient were reviewed by me and considered in my medical decision making (see chart for details).    MDM Rules/Calculators/A&P                          24 year old male presenting with nausea, vomiting, headache, chills.  Covid test from 2 days ago resulted positive.  Suspect his symptoms are related to COVID-19.  He also reported some abdominal pain associated with this, noted some generalized tenderness on initial exam however abdomen was soft without focal tenderness, on repeat exam, abdomen remains soft and there was no tenderness noted on careful inspection.  No leukocytosis, doubt acute abdominal process.  His chemistry panel was concerning for elevation in his creatinine, suspect related to dehydration.  Patient provided IV rehydration, antiemetics.  He had complete resolution of his symptoms, tolerating p.o. without difficulty.   He has no hypoxia or tachypnea.  Believe he is appropriate for discharge and outpatient management at this time.  Recommended follow-up with a primary care doctor, repeat BMP outpatient to ensure resolution after acute illness passes.  Gave patient strict return precautions should he develop severe vomiting again.  After the discussed management above, the patient was determined to be safe for discharge.  The patient was in agreement with this plan and all questions regarding their care were answered.  ED return precautions were discussed and the patient will return to the ED with any significant worsening of condition.  ZHANE BLUITT was evaluated in Emergency Department on 11/10/2019 for the symptoms described in the history of present illness. He was evaluated in the context of the global COVID-19 pandemic, which necessitated consideration that the patient might be at risk for infection with the SARS-CoV-2 virus that causes COVID-19. Institutional protocols and algorithms that pertain to the evaluation of patients at risk for COVID-19 are in a state of rapid change based on information released by regulatory bodies including the CDC and federal and state organizations. These policies and algorithms were followed during the patient's care in the ED.   Final Clinical Impression(s) / ED Diagnoses Final diagnoses:  Dehydration  COVID-19    Rx / DC Orders ED Discharge Orders    None       Milagros Loll, MD 11/10/19 1134

## 2019-11-10 NOTE — Discharge Instructions (Signed)
   Please take Zofran as needed for nausea.  If you have recurrent vomiting, any recurrent abdominal pain, difficulty in breathing or other new concerning symptom, return to ER for reassessment.  You should have your kidney function rechecked in 1 to 2weeks to ensure that your kidney function, creatinine has returned to baseline.  Please stay hydrated at home.  If you are unable to tolerate fluids, you need to be rechecked in the ER and have your blood work rechecked.  Please follow isolation precautions as discussed for your COVID-19.

## 2019-11-11 ENCOUNTER — Ambulatory Visit (HOSPITAL_COMMUNITY)
Admission: RE | Admit: 2019-11-11 | Discharge: 2019-11-11 | Disposition: A | Discharge status: Home / Self Care | Payer: HRSA Program | Source: Ambulatory Visit | Attending: Pulmonary Disease | Admitting: Pulmonary Disease

## 2019-11-11 ENCOUNTER — Other Ambulatory Visit: Payer: Self-pay | Admitting: Physician Assistant

## 2019-11-11 DIAGNOSIS — U071 COVID-19: Secondary | ICD-10-CM | POA: Diagnosis not present

## 2019-11-11 MED ORDER — PROCHLORPERAZINE MALEATE 10 MG PO TABS: 10.0000 mg | ORAL_TABLET | Freq: Four times a day (QID) | ORAL | 0 refills | Status: DC | PRN

## 2019-11-11 MED ORDER — METHYLPREDNISOLONE SODIUM SUCC 125 MG IJ SOLR: 125.0000 mg | Freq: Once | INTRAMUSCULAR | Status: DC | PRN

## 2019-11-11 MED ORDER — SODIUM CHLORIDE 0.9 % IV SOLN
1200.0000 mg | Freq: Once | INTRAVENOUS | Status: AC
  Administered 2019-11-11: 1200 mg via INTRAVENOUS

## 2019-11-11 MED ORDER — ALBUTEROL SULFATE HFA 108 (90 BASE) MCG/ACT IN AERS: 2.0000 | INHALATION_SPRAY | Freq: Once | RESPIRATORY_TRACT | Status: DC | PRN

## 2019-11-11 MED ORDER — PROCHLORPERAZINE EDISYLATE 10 MG/2ML IJ SOLN
10.0000 mg | INTRAMUSCULAR | Status: DC | PRN
  Administered 2019-11-11: 10 mg via INTRAVENOUS
  Filled 2019-11-11: qty 2

## 2019-11-11 MED ORDER — EPINEPHRINE 0.3 MG/0.3ML IJ SOAJ: 0.3000 mg | Freq: Once | INTRAMUSCULAR | Status: DC | PRN

## 2019-11-11 MED ORDER — DIPHENHYDRAMINE HCL 50 MG/ML IJ SOLN: 50.0000 mg | Freq: Once | INTRAMUSCULAR | Status: DC | PRN

## 2019-11-11 MED ORDER — FAMOTIDINE IN NACL 20-0.9 MG/50ML-% IV SOLN: 20.0000 mg | Freq: Once | INTRAVENOUS | Status: DC | PRN

## 2019-11-11 MED ORDER — SODIUM CHLORIDE 0.9 % IV SOLN: INTRAVENOUS | Status: DC | PRN

## 2019-11-11 NOTE — Progress Notes (Signed)
°  Diagnosis: COVID-19 ° °Physician: Dr. Wright ° °Procedure: Covid Infusion Clinic Med: casirivimab\imdevimab infusion - Provided patient with casirivimab\imdevimab fact sheet for patients, parents and caregivers prior to infusion. ° °Complications: No immediate complications noted. ° °Discharge: Discharged home  ° °Douglas Poole R Douglas Poole °11/11/2019 °

## 2019-11-11 NOTE — Discharge Instructions (Signed)

## 2019-11-13 ENCOUNTER — Telehealth: Payer: Self-pay | Admitting: *Deleted

## 2019-11-13 NOTE — Telephone Encounter (Addendum)
Please see previous note, pt gave permission to speak to his mother, Douglas Poole Search # [phone]. TOC CM reviewed chart and pt did receive another RX for Compazine on 11/11/2019 from Cline Crock PA for vomiting and nausea. Pt also had Zofran. Contacted [facility] and both Rx were picked up. Contacted pt's mother to make aware. She will follow up with pt about N/V and give call back to CM. Mother gave permission to do referral for COVID remote program. Faxed referral, Remote team will review referral and reach out to patient. Isidoro Donning RN CCM, [facility] ED TOC CM [phone]   1:58 pm TOC CM received call from pt's mother that he is having difficulty with keeping food down. States he was given Zofran but it is not working. Wanted to see if he could receive another Rx to help with vomiting. TOC CM contacted pt via phone. Received verbal consent to speak to his mother, Douglas Poole ongoing. States he has been taking Zofran as prescribed. Instructed pt to follow up at with PCP or seek medical attention if symptoms persist or worsen.  TOC CM sent message to ED provider.

## 2020-06-05 ENCOUNTER — Emergency Department (HOSPITAL_COMMUNITY)
Admission: EM | Admit: 2020-06-05 | Discharge: 2020-06-05 | Disposition: A | Discharge status: Home / Self Care | Payer: 59 | Attending: Emergency Medicine | Admitting: Emergency Medicine

## 2020-06-05 ENCOUNTER — Other Ambulatory Visit: Payer: Self-pay

## 2020-06-05 DIAGNOSIS — R519 Headache, unspecified: Secondary | ICD-10-CM | POA: Insufficient documentation

## 2020-06-05 DIAGNOSIS — R112 Nausea with vomiting, unspecified: Secondary | ICD-10-CM | POA: Insufficient documentation

## 2020-06-05 MED ORDER — ONDANSETRON 4 MG PO TBDP: 4.0000 mg | ORAL_TABLET | Freq: Three times a day (TID) | ORAL | 0 refills | Status: DC | PRN

## 2020-06-05 MED ORDER — ONDANSETRON 4 MG PO TBDP
8.0000 mg | ORAL_TABLET | Freq: Once | ORAL | Status: AC
  Administered 2020-06-05: 8 mg via ORAL
  Filled 2020-06-05: qty 2

## 2020-06-05 MED ORDER — MELATONIN 10 MG PO TABS: 10.0000 mg | ORAL_TABLET | Freq: Every evening | ORAL | 0 refills | Status: AC

## 2020-06-05 MED ORDER — OXYCODONE-ACETAMINOPHEN 5-325 MG PO TABS
2.0000 | ORAL_TABLET | Freq: Once | ORAL | Status: AC
  Administered 2020-06-05: 2 via ORAL
  Filled 2020-06-05: qty 2

## 2020-06-05 MED ORDER — PROMETHAZINE HCL 25 MG PO TABS: 25.0000 mg | ORAL_TABLET | Freq: Four times a day (QID) | ORAL | 0 refills | Status: DC | PRN

## 2020-06-05 NOTE — ED Triage Notes (Signed)
Pt presents to the ED with head pain that is causing nausea and vomiting for 2 days. States he gets migraines when he doesn't get enough sleep. Denies blurred vision.

## 2020-06-05 NOTE — ED Notes (Signed)
Pt had no episodes of vomiting since PO challenge.

## 2020-06-05 NOTE — Discharge Instructions (Addendum)
Please drink plenty of water.  Please use melatonin 10 mg daily for difficulty sleeping.  I have also prescribed you Zofran which is a nausea medication.  Please abstain from alcohol or any recreational drugs.

## 2020-06-05 NOTE — ED Provider Notes (Signed)
[facility] EMERGENCY DEPARTMENT Provider Note   CSN: [id number] Arrival date & time: 06/05/20  1008     History Chief Complaint  Patient presents with  . Migraine    Douglas Poole is a 25 y.o. male.  HPI Patient is a 25 year old male presented today with frontal headache.  He states that this is been ongoing since this morning.  He states that he has a history of similar headaches.  He states that it is currently no longer affecting him.  He states that he was given medication when he was seen during his MSE process and now feels completely improved.  He denies any vision changes nausea or vomiting.  He states that he gets headaches like this every few months specifically seems to be caused by insomnia.  He states he takes no medications for sleep aid.  Denies any weakness or numbness or slurred speech.  Denies any other associate symptoms.  States he is completely pain-free presently and without any symptoms.  States that he did have some nausea and has had 2 or 3 episodes of nonbloody nonbilious emesis.  None since given Zofran in triage.    No past medical history on file.  Patient Active Problem List   Diagnosis Date Noted  . Acne 12/13/2012    Past Surgical History:  Procedure Laterality Date  . KNEE SURGERY  2014       No family history on file.  Social History   Tobacco Use  . Smoking status: Never Smoker  . Smokeless tobacco: Never Used  Vaping Use  . Vaping Use: Every day  Substance Use Topics  . Alcohol use: No  . Drug use: No    Home Medications Prior to Admission medications   Medication Sig Start Date End Date Taking? Authorizing Provider  Melatonin 10 MG TABS Take 10 mg by mouth at bedtime. 06/05/20  Yes Chatara Lucente S, PA  ondansetron (ZOFRAN ODT) 4 MG disintegrating tablet Take 1 tablet (4 mg total) by mouth every 8 (eight) hours as needed for nausea or vomiting. 06/05/20  Yes Tzippy Testerman S, PA  pantoprazole (PROTONIX)  20 MG tablet Take 1 tablet (20 mg total) by mouth daily. 06/14/19   Bethel Born, PA-C  PRESCRIPTION MEDICATION once.    [provider]  prochlorperazine (COMPAZINE) 10 MG tablet Take 1 tablet (10 mg total) by mouth every 6 (six) hours as needed for nausea or vomiting. 11/11/19   Janetta Hora, PA-C    Allergies    Patient has no known allergies.  Review of Systems   Review of Systems  Constitutional: Negative for chills and fever.  HENT: Negative for congestion.   Respiratory: Negative for shortness of breath.   Cardiovascular: Negative for chest pain.  Gastrointestinal: Positive for nausea and vomiting. Negative for abdominal pain.  Musculoskeletal: Negative for neck pain.  Neurological: Positive for headaches.    Physical Exam Updated Poole Signs BP (!) 114/51 (BP Location: Right Arm)   Pulse 83   Temp 98.4 F (36.9 C) (Oral)   Resp 15   SpO2 98%   Physical Exam Vitals and nursing note reviewed.  Constitutional:      General: He is not in acute distress. HENT:     Head: Normocephalic and atraumatic.     Nose: Nose normal.  Eyes:     General: No scleral icterus. Cardiovascular:     Rate and Rhythm: Normal rate and regular rhythm.     Pulses:  Normal pulses.     Heart sounds: Normal heart sounds.  Pulmonary:     Effort: Pulmonary effort is normal. No respiratory distress.     Breath sounds: No wheezing.  Abdominal:     Palpations: Abdomen is soft.     Tenderness: There is no abdominal tenderness.  Musculoskeletal:     Cervical back: Normal range of motion.     Right lower leg: No edema.     Left lower leg: No edema.  Skin:    General: Skin is warm and dry.     Capillary Refill: Capillary refill takes less than 2 seconds.  Neurological:     Mental Status: He is alert. Mental status is at baseline.     Comments: Alert and oriented to self, place, time and event.   Speech is fluent, clear without dysarthria or dysphasia.   Strength 5/5 in  upper/lower extremities  Sensation intact in upper/lower extremities   CN I not tested  CN II grossly intact visual fields bilaterally. Did not visualize posterior eye.   CN III, IV, VI PERRLA and EOMs intact bilaterally  CN V Intact sensation to sharp and light touch to the face  CN VII facial movements symmetric  CN VIII not tested  CN IX, X no uvula deviation, symmetric rise of soft palate  CN XI 5/5 SCM and trapezius strength bilaterally  CN XII Midline tongue protrusion, symmetric L/R movements   Psychiatric:        Mood and Affect: Mood normal.        Behavior: Behavior normal.     ED Results / Procedures / Treatments   Labs (all labs ordered are listed, but only abnormal results are displayed) Labs Reviewed - No data to display  EKG None  Radiology No results found.  Procedures Procedures   Medications Ordered in ED Medications  ondansetron (ZOFRAN-ODT) disintegrating tablet 8 mg (8 mg Oral Given 06/05/20 1056)  oxyCODONE-acetaminophen (PERCOCET/ROXICET) 5-325 MG per tablet 2 tablet (2 tablets Oral Given 06/05/20 1057)    ED Course  I have reviewed the triage Poole signs and the nursing notes.  Pertinent labs & imaging results that were available during my care of the patient were reviewed by me and considered in my medical decision making (see chart for details).    MDM Rules/Calculators/A&P                          Patient with history of nausea vomiting and headaches has been told that he has migraines but has not been formally diagnosed or seen by neurology.  He is presented today completely asymptomatic at this time given that he was assessed in triage and provided analgesia and nausea medicine.  P.o. challenged without any emesis.  Patient symptoms seem to be caused by sleep deprivation.  Will provide with prescription for Zofran, melatonin.  After discussion with mother she would prefer her to be discharged with Phenergan instead of Zofran.   Patient is  neurologically intact has Poole signs within normal limits and is well-appearing.  P.o. challenge was successful.  Discharged after discussion with mother and patient.  They are agreeable to plan.  We will follow-up with neurology for his migraines/headaches.  Final Clinical Impression(s) / ED Diagnoses Final diagnoses:  Bad headache    Rx / DC Orders ED Discharge Orders         Ordered    ondansetron (ZOFRAN ODT) 4 MG disintegrating tablet  Every  8 hours PRN        06/05/20 1252    Melatonin 10 MG TABS  Nightly        06/05/20 1254           Gailen Shelter, [place] 06/05/20 1326    Vanetta Mulders, MD 06/06/20 [phone]

## 2020-06-05 NOTE — ED Triage Notes (Signed)
Emergency Medicine Provider Triage Evaluation Note  TINO RONAN , a 25 y.o. male  was evaluated in triage.  Pt complains of migraine headache.  Review of Systems  Positive: Nausea and vomiting Negative: Fever, chills  Physical Exam  BP (!) 153/101 (BP Location: Left Leg)   Pulse (!) 58   Temp 99.1 F (37.3 C) (Oral)   Resp 18   SpO2 100%  Gen:   Awake, no distress   HEENT:  Atraumatic  Resp:  Normal effort  Cardiac:  Normal rate  Abd:   Nondistended, nontender  MSK:   Moves extremities without difficulty  Neuro:  Speech clear   Medical Decision Making  Medically screening exam initiated at 10:47 AM.  Appropriate orders placed.  Bosie Helper was informed that the remainder of the evaluation will be completed by another provider, this initial triage assessment does not replace that evaluation, and the importance of remaining in the ED until their evaluation is complete.  Clinical Impression  Migraine headache with nausea and vomiting zofran and percocet ordered   Margarita Grizzle, MD 06/05/20 1049

## 2020-06-07 ENCOUNTER — Encounter (HOSPITAL_COMMUNITY): Payer: Self-pay | Admitting: Emergency Medicine

## 2020-06-07 ENCOUNTER — Emergency Department (HOSPITAL_COMMUNITY)
Admission: EM | Admit: 2020-06-07 | Discharge: 2020-06-07 | Disposition: A | Discharge status: Home / Self Care | Payer: 59 | Attending: Emergency Medicine | Admitting: Emergency Medicine

## 2020-06-07 ENCOUNTER — Other Ambulatory Visit: Payer: Self-pay

## 2020-06-07 ENCOUNTER — Emergency Department (HOSPITAL_COMMUNITY): Payer: 59

## 2020-06-07 DIAGNOSIS — R112 Nausea with vomiting, unspecified: Secondary | ICD-10-CM | POA: Diagnosis present

## 2020-06-07 DIAGNOSIS — R1013 Epigastric pain: Secondary | ICD-10-CM | POA: Insufficient documentation

## 2020-06-07 DIAGNOSIS — R079 Chest pain, unspecified: Secondary | ICD-10-CM | POA: Diagnosis not present

## 2020-06-07 DIAGNOSIS — R070 Pain in throat: Secondary | ICD-10-CM | POA: Diagnosis not present

## 2020-06-07 DIAGNOSIS — R109 Unspecified abdominal pain: Secondary | ICD-10-CM

## 2020-06-07 LAB — CBC WITH DIFFERENTIAL/PLATELET
Abs Immature Granulocytes: 0.02 10*3/uL (ref 0.00–0.07)
Basophils Absolute: 0 10*3/uL (ref 0.0–0.1)
Basophils Relative: 1 %
Eosinophils Absolute: 0 10*3/uL (ref 0.0–0.5)
Eosinophils Relative: 0 %
HCT: 55.9 % — ABNORMAL HIGH (ref 39.0–52.0)
Hemoglobin: 19.6 g/dL — ABNORMAL HIGH (ref 13.0–17.0)
Immature Granulocytes: 0 %
Lymphocytes Relative: 22 %
Lymphs Abs: 1.4 10*3/uL (ref 0.7–4.0)
MCH: 31.5 pg (ref 26.0–34.0)
MCHC: 35.1 g/dL (ref 30.0–36.0)
MCV: 89.7 fL (ref 80.0–100.0)
Monocytes Absolute: 0.8 10*3/uL (ref 0.1–1.0)
Monocytes Relative: 12 %
Neutro Abs: 4.1 10*3/uL (ref 1.7–7.7)
Neutrophils Relative %: 65 %
Platelets: 266 10*3/uL (ref 150–400)
RBC: 6.23 MIL/uL — ABNORMAL HIGH (ref 4.22–5.81)
RDW: 12.3 % (ref 11.5–15.5)
WBC: 6.4 10*3/uL (ref 4.0–10.5)
nRBC: 0 % (ref 0.0–0.2)

## 2020-06-07 LAB — COMPREHENSIVE METABOLIC PANEL
ALT: 22 U/L (ref 0–44)
AST: 31 U/L (ref 15–41)
Albumin: 4.8 g/dL (ref 3.5–5.0)
Alkaline Phosphatase: 81 U/L (ref 38–126)
Anion gap: 17 — ABNORMAL HIGH (ref 5–15)
BUN: 23 mg/dL — ABNORMAL HIGH (ref 6–20)
CO2: 28 mmol/L (ref 22–32)
Calcium: 10 mg/dL (ref 8.9–10.3)
Chloride: 91 mmol/L — ABNORMAL LOW (ref 98–111)
Creatinine, Ser: 1.18 mg/dL (ref 0.61–1.24)
GFR, Estimated: >60 mL/min (ref >60)
Glucose, Bld: 106 mg/dL — ABNORMAL HIGH (ref 70–99)
Potassium: 3.2 mmol/L — ABNORMAL LOW (ref 3.5–5.1)
Sodium: 136 mmol/L (ref 135–145)
Total Bilirubin: 1.1 mg/dL (ref 0.3–1.2)
Total Protein: 8.1 g/dL (ref 6.5–8.1)

## 2020-06-07 LAB — LIPASE, BLOOD: Lipase: 31 U/L (ref 11–51)

## 2020-06-07 MED ORDER — ALUM & MAG HYDROXIDE-SIMETH 200-200-20 MG/5ML PO SUSP
30.0000 mL | Freq: Once | ORAL | Status: AC
  Administered 2020-06-07: 30 mL via ORAL
  Filled 2020-06-07: qty 30

## 2020-06-07 MED ORDER — LACTATED RINGERS IV BOLUS
1000.0000 mL | Freq: Once | INTRAVENOUS | Status: AC
  Administered 2020-06-07: 1000 mL via INTRAVENOUS

## 2020-06-07 MED ORDER — ONDANSETRON HCL 4 MG/2ML IJ SOLN
4.0000 mg | Freq: Once | INTRAMUSCULAR | Status: AC
  Administered 2020-06-07: 4 mg via INTRAVENOUS
  Filled 2020-06-07: qty 2

## 2020-06-07 MED ORDER — LIDOCAINE VISCOUS HCL 2 % MT SOLN
15.0000 mL | Freq: Once | OROMUCOSAL | Status: AC
  Administered 2020-06-07: 15 mL via ORAL
  Filled 2020-06-07: qty 15

## 2020-06-07 MED ORDER — HALOPERIDOL LACTATE 5 MG/ML IJ SOLN
2.0000 mg | Freq: Once | INTRAMUSCULAR | Status: AC
  Administered 2020-06-07: 2 mg via INTRAVENOUS
  Filled 2020-06-07: qty 1

## 2020-06-07 MED ORDER — FAMOTIDINE 20 MG PO TABS: 20.0000 mg | ORAL_TABLET | Freq: Every day | ORAL | 0 refills | Status: DC

## 2020-06-07 MED ORDER — PROMETHAZINE HCL 25 MG PO TABS: 25.0000 mg | ORAL_TABLET | Freq: Four times a day (QID) | ORAL | 0 refills | Status: DC | PRN

## 2020-06-07 MED ORDER — MORPHINE SULFATE (PF) 4 MG/ML IV SOLN
4.0000 mg | Freq: Once | INTRAVENOUS | Status: AC
  Administered 2020-06-07: 4 mg via INTRAVENOUS
  Filled 2020-06-07: qty 1

## 2020-06-07 MED ORDER — OMEPRAZOLE 40 MG PO CPDR: 40.0000 mg | DELAYED_RELEASE_CAPSULE | Freq: Every day | ORAL | 0 refills | Status: DC

## 2020-06-07 NOTE — ED Notes (Signed)
Pt d/c by MD and is provided w/ d.c instructions and follow up care, Pt out of  ED ambulatory by self  

## 2020-06-07 NOTE — ED Triage Notes (Addendum)
C/o generalized abd pain with nausea and vomiting x 4 days.  Diarrhea the first day that resolved.  States he was seen in ED a few days ago and not feeling any better.

## 2020-06-07 NOTE — Discharge Instructions (Addendum)
You were seen in the ER for nausea, vomiting, abdominal pain. Labs were normal. X-rays are normal.  I suspect your symptoms may be from gastritis, acid reflux or possibly ulcer.  Could have also been a virus. All of these are managed similarly.   We will treat this with anti-acid medicines.  Start taking omeprazole to 40 mg daily, take on an empty stomach first thing in the morning and wait 20-30 min before eating.  Add famotidine 20 mg in PM.  Use phenergan for nausea as needed.  Tylenol (acetaminophen) for pain.   Avoid irritating foods and liquids such as alcohol, greasy/fatty or acidic foods. Avoid ibuprofen or aspirin containing products. Avoid marijuana. All of these can worsen gastritis and lead to recurrent nausea vomiting   Follow up with gastroenterology for further management of this if it persits.   Return to the ER for fever, chills, blood in vomit or stool, worsening localized abdominal pain to right upper or right lower abdomen, inability to tolerate fluids.

## 2020-06-07 NOTE — ED Provider Notes (Addendum)
[facility] Provider Note   CSN: [id number] Arrival date & time: 06/07/20  1057     History Chief Complaint  Patient presents with  . Abdominal Pain    Bosie HelperKameron I Poole is a 25 y.o. male with history of gastritis presents to the ED for evaluation of nausea associated with vomiting, abdominal pain for the last 4 days.  States initially he started having a migraine but this is completely resolved.  Reports so far in the last 10 hours he has vomited at least 10 times.  His vomit is clear and "beige", no blood.  No coffee-ground emesis.  Has epigastric abdominal pain that radiates into the center of his chest and into his throat.  This is constant but worse when he is actively vomiting.  It is also worse when he takes deep breaths, moves.  He last had a bowel movement 4 days ago when all of his symptoms started.  But states he has not been able to drink or eat anything because he just throws it back up.  Also reports dark and decreased urine.  He came to the ED for headache and difficulty sleeping 4 days ago as well as nausea and vomiting.  He was discharged with Zofran and melatonin but states the Zofran is not helping.  Does state that he has had issues with insomnia for a long time.  He thinks his symptoms may be from overworking and not sleeping well.  He has picked up more double shifts and his roommates wake up early and he is not really able to sleep.  He is waiting to find a primary care doctor or a specialist to help with his insomnia problems.  He came to the ED a few years ago for nausea, vomiting and abdominal pain and he was told he had gastritis.  He does not take any medicines for this.  Denies alcohol use.  Reports using marijuana once a week at most.  Smoked last yesterday, states smoking marijuana makes his nausea, vomiting and abdominal pain better. Does not frequently use NSAIDs.  Denies fevers, runny nose, sore throat, cough.  No dysuria,  frequency.  No abdominal surgeries.  Has never had any issues with his pancreas or gallbladder.  No sick contacts.  No recent intake of suspicious foods.  Mother at bedside provides additional history.  States that these symptoms happen every 3 or 4 months.  She is wondering if patient is doing anything to makes his symptoms worse.  HPI     History reviewed. No pertinent past medical history.  Patient Active Problem List   Diagnosis Date Noted  . Acne 12/13/2012    Past Surgical History:  Procedure Laterality Date  . KNEE SURGERY  2014       No family history on file.  Social History   Tobacco Use  . Smoking status: Never Smoker  . Smokeless tobacco: Never Used  Vaping Use  . Vaping Use: Every day  Substance Use Topics  . Alcohol use: No  . Drug use: No    Home Medications Prior to Admission medications   Medication Sig Start Date End Date Taking? Authorizing Provider  Melatonin 10 MG TABS Take 10 mg by mouth at bedtime. 06/05/20  Yes Fondaw, Wylder S, PA  omeprazole (PRILOSEC) 40 MG capsule Take 1 capsule (40 mg total) by mouth daily for 10 days. 06/07/20 06/17/20 Yes Sharen HeckGibbons, Crixus Mcaulay J, PA-C  ondansetron (ZOFRAN ODT) 4 MG disintegrating tablet Take 1  tablet (4 mg total) by mouth every 8 (eight) hours as needed for nausea or vomiting. 06/05/20  Yes Fondaw, Rodrigo Ran, PA  promethazine (PHENERGAN) 25 MG tablet Take 1 tablet (25 mg total) by mouth every 6 (six) hours as needed for nausea or vomiting. 06/07/20  Yes Sharen Heck J, PA-C  pantoprazole (PROTONIX) 20 MG tablet Take 1 tablet (20 mg total) by mouth daily. Patient not taking: No sig reported 06/14/19   Bethel Born, PA-C  prochlorperazine (COMPAZINE) 10 MG tablet Take 1 tablet (10 mg total) by mouth every 6 (six) hours as needed for nausea or vomiting. Patient not taking: No sig reported 11/11/19   Janetta Hora, PA-C    Allergies    Patient has no known allergies.  Review of Systems   Review of  Systems  HENT: Positive for sore throat.   Cardiovascular: Positive for chest pain.  Gastrointestinal: Positive for abdominal pain, nausea and vomiting.  All other systems reviewed and are negative.   Physical Exam Updated Vital Signs BP 118/81   Pulse (!) 102   Temp 98.2 F (36.8 C)   Resp 18   SpO2 98%   Physical Exam Vitals and nursing note reviewed.  Constitutional:      Appearance: He is well-developed.     Comments: Non toxic.  HENT:     Head: Normocephalic and atraumatic.     Nose: Nose normal.     Mouth/Throat:     Comments: Lips are dry but MMM Eyes:     Conjunctiva/sclera: Conjunctivae normal.  Cardiovascular:     Rate and Rhythm: Normal rate and regular rhythm.     Heart sounds: Normal heart sounds.  Pulmonary:     Effort: Pulmonary effort is normal.     Breath sounds: Normal breath sounds.  Abdominal:     General: Bowel sounds are normal.     Palpations: Abdomen is soft.     Tenderness: There is abdominal tenderness in the epigastric area.     Comments: No G/R/R. No suprapubic or CVA tenderness. Negative Murphy's and McBurney's  Musculoskeletal:        General: Normal range of motion.     Cervical back: Normal range of motion.  Skin:    General: Skin is warm and dry.     Capillary Refill: Capillary refill takes less than 2 seconds.  Neurological:     Mental Status: He is alert.  Psychiatric:        Behavior: Behavior normal.     ED Results / Procedures / Treatments   Labs (all labs ordered are listed, but only abnormal results are displayed) Labs Reviewed  CBC WITH DIFFERENTIAL/PLATELET - Abnormal; Notable for the following components:      Result Value   RBC 6.23 (*)    Hemoglobin 19.6 (*)    HCT 55.9 (*)    All other components within normal limits  COMPREHENSIVE METABOLIC PANEL - Abnormal; Notable for the following components:   Potassium 3.2 (*)    Chloride 91 (*)    Glucose, Bld 106 (*)    BUN 23 (*)    Anion gap 17 (*)    All other  components within normal limits  LIPASE, BLOOD  URINALYSIS, ROUTINE W REFLEX MICROSCOPIC    EKG None  Radiology DG Chest 2 View  Result Date: 06/07/2020 CLINICAL DATA:  Chest pain and vomiting EXAM: CHEST - 2 VIEW COMPARISON:  Report from chest radiograph dated 02/23/1998. FINDINGS: The heart size and  mediastinal contours are within normal limits. Both lungs are clear. The visualized skeletal structures are unremarkable. IMPRESSION: No active cardiopulmonary disease. Electronically Signed   By: Romona Curls M.D.   On: 06/07/2020 12:48   DG Abd 1 View  Result Date: 06/07/2020 CLINICAL DATA:  Chest pain and vomiting. EXAM: ABDOMEN - 1 VIEW COMPARISON:  CT abdomen pelvis dated 05/08/2018. FINDINGS: The bowel gas pattern is normal. No radio-opaque calculi or other significant radiographic abnormality are seen. IMPRESSION: Negative. Electronically Signed   By: Romona Curls M.D.   On: 06/07/2020 12:49    Procedures Procedures   Medications Ordered in ED Medications  morphine 4 MG/ML injection 4 mg (4 mg Intravenous Given 06/07/20 1155)  ondansetron (ZOFRAN) injection 4 mg (4 mg Intravenous Given 06/07/20 1154)  lactated ringers bolus 1,000 mL (1,000 mLs Intravenous New Bag/Given 06/07/20 1205)  alum & mag hydroxide-simeth (MAALOX/MYLANTA) 200-200-20 MG/5ML suspension 30 mL (30 mLs Oral Given 06/07/20 1336)    And  lidocaine (XYLOCAINE) 2 % viscous mouth solution 15 mL (15 mLs Oral Given 06/07/20 1336)  haloperidol lactate (HALDOL) injection 2 mg (2 mg Intravenous Given 06/07/20 1336)    ED Course  I have reviewed the triage vital signs and the nursing notes.  Pertinent labs & imaging results that were available during my care of the patient were reviewed by me and considered in my medical decision making (see chart for details).  Clinical Course as of 06/07/20 1511  Sat Jun 07, 2020  1308 Glucose(!): 106 [CG]  1308 Potassium(!): 3.2 [CG]  1308 BUN(!): 23 [CG]  1308 Anion gap(!): 17  [CG]  1308 Hemoglobin(!): 19.6 [CG]  1308 HCT(!): 55.9 [CG]  1308 WBC: 6.4 [CG]  1308 Lipase: 31 [CG]  1439 Patient re-evaluated reports feeling 100% better.  Will discharge.  [CG]    Clinical Course User Index [CG] Jerrell Mylar   MDM Rules/Calculators/A&P                          25 year old male presents to the ED for nausea, vomiting, epigastric abdominal pain that radiates to his chest, throat and decreased oral intake for the last 4 days.  Had diarrhea on the first day and headache that resolved completely.  Reports smoking marijuana once weekly.  States smoking marijuana has helped his nausea and vomiting and abdominal pain, last use yesterday.  Mother at bedside states the symptoms seem to be recurrent every 3 to 4 months.  Does not take anything for gastritis.  EMR, triage nursing notes reviewed.  Seen in the ED for migraine, nausea and vomiting 4 days ago.  DDx- viral gastroenteritis given initial headache, diarrhea.  Uncontrolled gastritis or GERD also possibility.  He reports weekly use of marijuana and could be causing cannabinoid hyperemesis syndrome.  Other less likely etiologies given duration of his symptoms include pancreatitis, cholecystitis, symptomatic cholelithiasis.  He reports pain from his epigastrium to his chest and throat but no hematemesis.  He is hemodynamically stable.  Perforated viscus or esophageal perf considered less likely.  Suspect Mallory-Weiss tear.  Labs, imaging ordered by me as above.  These were personally reviewed and interpreted.  Labs reveal-hemoconcentration hemoglobin 19.6, HCT 55.9.  Normal WBC.  BUN is 23 however creatinine is normal.  Potassium is 3.2.  LFTs, lipase unremarkable.  Imaging reveals- chest x-ray/KUB without acute findings, free air, obstructive pattern.  EKG shows sinus tachycardia.  Medicines given in the ED-morphine, Zofran, LR bolus.  Patient reevaluated and reported return of nausea.  Haldol, GI cocktail  ordered,  will reassess.  1508: Patient reevaluated.  He reports 100% improvement and resolution of his symptoms.  Tolerating p.o.  No longer having abdominal pain, no abdominal tenderness on exam.:  Given reassuring work-up, and chronicity/duration of his cyclical symptoms, complete resolution of his symptoms today after medicines high suspicion for GI etiology like GERD, gastritis or possibly cannabinoid hyperemesis syndrome.  Discussed with patient importance of stopping marijuana use and avoiding NSAIDs, diet modifications.  Will discharge with Protonix qAM, pepcid qHS, Zofran and p.o. potassium for 1 week.  Recommended GI follow-up.  Return precautions discussed. Final Clinical Impression(s) / ED Diagnoses Final diagnoses:  Epigastric abdominal pain  Nausea and vomiting in adult    Rx / DC Orders ED Discharge Orders         Ordered    omeprazole (PRILOSEC) 40 MG capsule  Daily        06/07/20 1511    promethazine (PHENERGAN) 25 MG tablet  Every 6 hours PRN        06/07/20 1511           Jerrell Mylar 06/07/20 1514    Linwood Dibbles, MD 06/08/20 [phone]

## 2020-06-18 ENCOUNTER — Other Ambulatory Visit: Payer: Self-pay

## 2020-06-18 ENCOUNTER — Ambulatory Visit (INDEPENDENT_AMBULATORY_CARE_PROVIDER_SITE_OTHER): Payer: 59 | Admitting: Family Medicine

## 2020-06-18 ENCOUNTER — Encounter: Payer: Self-pay | Admitting: Family Medicine

## 2020-06-18 VITALS — BP 118/78 | HR 79 | Temp 98.4°F | Ht 72.0 in | Wt 184.5 lb

## 2020-06-18 DIAGNOSIS — G4700 Insomnia, unspecified: Secondary | ICD-10-CM

## 2020-06-18 DIAGNOSIS — G43109 Migraine with aura, not intractable, without status migrainosus: Secondary | ICD-10-CM

## 2020-06-18 MED ORDER — PROCHLORPERAZINE MALEATE 5 MG PO TABS
5.0000 mg | ORAL_TABLET | Freq: Four times a day (QID) | ORAL | 0 refills | Status: AC | PRN
Start: 2020-06-18 — End: ?

## 2020-06-18 MED ORDER — SUMATRIPTAN SUCCINATE 100 MG PO TABS: 100.0000 mg | ORAL_TABLET | ORAL | 0 refills | Status: DC | PRN

## 2020-06-18 MED ORDER — AMITRIPTYLINE HCL 25 MG PO TABS: 25.0000 mg | ORAL_TABLET | Freq: Every day | ORAL | 2 refills | Status: DC

## 2020-06-18 NOTE — Patient Instructions (Addendum)
Try to get 7 hours of sleep nightly.  Sleep Hygiene Tips:  Do not watch TV or look at screens within 1 hour of going to bed. If you do, make sure there is a blue light filter (nighttime mode) involved.  Try to go to bed around the same time every night. Wake up at the same time within 1 hour of regular time. Ex: If you wake up at 7 AM for work, do not sleep past 8 AM on days that you don't work.  Do not drink alcohol before bedtime.  Do not consume caffeine-containing beverages after noon or within 9 hours of intended bedtime.  Get regular exercise/physical activity in your life, but not within 2 hours of planned bedtime.  Do not take naps.   Do not eat within 2 hours of planned bedtime.  Melatonin, 3-5 mg 30-60 minutes before planned bedtime may be helpful.   The bed should be for sleep or sex only. If after 20-30 minutes you are unable to fall asleep, get up and do something relaxing. Do this until you feel ready to go to sleep again.   Aim to do some physical exertion for 150 minutes per week. This is typically divided into 5 days per week, 30 minutes per day. The activity should be enough to get your heart rate up. Anything is better than nothing if you have time constraints.  Let us know if you need anything.

## 2020-06-18 NOTE — Progress Notes (Signed)
Chief Complaint  Patient presents with  . New Patient (Initial Visit)  . Insomnia  . Migraine       New Patient Visit SUBJECTIVE: HPI: Douglas Poole is an 25 y.o.male who is being seen for establishing care. Here w mom.   Migraines/HA's Patient has a 10-year history of migraines.  He will mainly get pain behind both of his eyes.  Stress and lack of sleep exacerbate symptoms.  He gets sensitive to light and sound.  Most recently he also has associated nausea and vomiting.  He has never been on a daily medicine before.  He does not take anything when his migraines start.  Sometimes he is able to function but many times he is not.  He gets around 12-15 headaches in the month on average.  No neurologic signs or symptoms otherwise.  Insomnia Since he was an adolescent, the patient will have frequently interrupted sleep.  He sometimes feels well rested.  He does snore at night but no never tells him he stops breathing and he does not wake up gasping for air.  He will have racing thoughts but denies any anxiety or depression.  If he naps during the day, he feels better.  Past Medical History:  Diagnosis Date  . No known health problems    Past Surgical History:  Procedure Laterality Date  . OTHER SURGICAL HISTORY     25 years old, repaired injury of L middle and ring finger   Family History  Problem Relation Age of Onset  . Lung cancer Maternal Grandfather   . Colon cancer Other   . Heart disease Neg Hx    No Known Allergies  Current Outpatient Medications:  .  amitriptyline (ELAVIL) 25 MG tablet, Take 1 tablet (25 mg total) by mouth at bedtime., Disp: 30 tablet, Rfl: 2 .  Melatonin 10 MG TABS, Take 10 mg by mouth at bedtime., Disp: 30 tablet, Rfl: 0 .  prochlorperazine (COMPAZINE) 5 MG tablet, Take 1 tablet (5 mg total) by mouth every 6 (six) hours as needed for nausea or vomiting., Disp: 30 tablet, Rfl: 0 .  SUMAtriptan (IMITREX) 100 MG tablet, Take 1 tablet (100 mg total) by  mouth every 2 (two) hours as needed for migraine. May repeat in 2 hours if headache persists or recurs., Disp: 10 tablet, Rfl: 0  OBJECTIVE: BP 118/78 (BP Location: Left Arm, Patient Position: Sitting, Cuff Size: Normal)   Pulse 79   Temp 98.4 F (36.9 C) (Oral)   Ht 6' (1.829 m)   Wt 184 lb 8 oz (83.7 kg)   SpO2 97%   BMI 25.02 kg/m  General:  well developed, well nourished, in no apparent distress Skin:  no significant moles, warts, or growths Throat/Pharynx:  lips and gingiva without lesion; tongue and uvula midline; non-inflamed pharynx; no exudates or postnasal drainage Lungs:  clear to auscultation, breath sounds equal bilaterally, no respiratory distress Cardio:  regular rate and rhythm, no LE edema or bruits Musculoskeletal:  symmetrical muscle groups noted without atrophy or deformity Neuro:  gait normal, DTRs equal and symmetric throughout, 5/5 strength throughout, no cerebellar signs Psych: well oriented with normal range of affect and appropriate judgment/insight  ASSESSMENT/PLAN: Migraine with aura and without status migrainosus, not intractable - Plan: prochlorperazine (COMPAZINE) 5 MG tablet, amitriptyline (ELAVIL) 25 MG tablet, SUMAtriptan (IMITREX) 100 MG tablet  Insomnia, unspecified type - Plan: amitriptyline (ELAVIL) 25 MG tablet  1.  Start Elavil 25 mg daily for prophylaxis.  Compazine for nausea  as needed, Imitrex for abortive therapy. 2.  Counseling team information on sleep hygiene facts provided.  Elavil may help with this as well. Patient should return in 1 month recheck. The patient and his mother voiced understanding and agreement to the plan.   Jilda Roche [place], DO 06/18/20  10:51 AM

## 2020-07-21 ENCOUNTER — Ambulatory Visit: Payer: 59 | Admitting: Family Medicine

## 2020-07-28 ENCOUNTER — Other Ambulatory Visit: Payer: Self-pay

## 2020-07-28 ENCOUNTER — Ambulatory Visit (INDEPENDENT_AMBULATORY_CARE_PROVIDER_SITE_OTHER): Payer: 59 | Admitting: Family Medicine

## 2020-07-28 ENCOUNTER — Encounter: Payer: Self-pay | Admitting: Family Medicine

## 2020-07-28 VITALS — BP 108/70 | HR 96 | Temp 99.1°F | Ht 72.0 in | Wt 186.0 lb

## 2020-07-28 DIAGNOSIS — G4700 Insomnia, unspecified: Secondary | ICD-10-CM | POA: Diagnosis not present

## 2020-07-28 DIAGNOSIS — G43109 Migraine with aura, not intractable, without status migrainosus: Secondary | ICD-10-CM | POA: Insufficient documentation

## 2020-07-28 MED ORDER — AMITRIPTYLINE HCL 25 MG PO TABS: 25.0000 mg | ORAL_TABLET | Freq: Every day | ORAL | 2 refills | Status: AC

## 2020-07-28 MED ORDER — SUMATRIPTAN SUCCINATE 100 MG PO TABS: 100.0000 mg | ORAL_TABLET | ORAL | 5 refills | Status: AC | PRN

## 2020-07-28 NOTE — Progress Notes (Signed)
Chief Complaint  Patient presents with   Follow-up    Subjective: Patient is a 25 y.o. male here for f/u.  Migraines The patient has had 1 headache for the past month.  He started Elavil 25 mg nightly.  He took Imitrex 100 mg and it helped with his headache.  He notes that the headache was of less severity than he usually experiences.  He is pleased with his current progress.  No adverse effects with either medications.  He reports compliance with Elavil.  He did not need to take the Compazine for nausea.  Insomnia The patient started Elavil 25 mg nightly.  He reports compliance without adverse effects.  He notes he is sleeping much better.  Past Medical History:  Diagnosis Date   No known health problems     Objective: BP 108/70 (BP Location: Left Arm, Patient Position: Sitting, Cuff Size: Normal)   Pulse 96   Temp 99.1 F (37.3 C) (Oral)   Ht 6' (1.829 m)   Wt 186 lb (84.4 kg)   SpO2 97%   BMI 25.23 kg/m  General: Awake, appears stated age Neuro: DTR's equal and symmetric throughout, no clonus, no cerebellar signs, 5/5 strength throughout Lungs: No accessory muscle use Psych: Age appropriate judgment and insight, normal affect and mood  Assessment and Plan: Migraine with aura and without status migrainosus, not intractable - Plan: SUMAtriptan (IMITREX) 100 MG tablet, amitriptyline (ELAVIL) 25 MG tablet  Insomnia, unspecified type - Plan: amitriptyline (ELAVIL) 25 MG tablet  Chronic, stable. Cont Elavil 25 mg/d. Imitrex prn. Chronic, stable. Cont Elavil 25 mg/d.  F/u in 6 mo for CPE or prn. The patient voiced understanding and agreement to the plan.  Jilda Roche [place], DO 07/28/20  3:16 PM

## 2020-07-28 NOTE — Patient Instructions (Signed)
Let us know if you need anything.  

## 2020-11-19 ENCOUNTER — Encounter: Payer: Self-pay | Admitting: Urgent Care

## 2020-11-19 ENCOUNTER — Other Ambulatory Visit: Payer: Self-pay

## 2020-11-19 ENCOUNTER — Ambulatory Visit
Admission: EM | Admit: 2020-11-19 | Discharge: 2020-11-19 | Disposition: A | Discharge status: Home / Self Care | Payer: 59

## 2020-11-19 ENCOUNTER — Telehealth: Payer: Self-pay

## 2020-11-19 DIAGNOSIS — G43809 Other migraine, not intractable, without status migrainosus: Secondary | ICD-10-CM | POA: Diagnosis not present

## 2020-11-19 HISTORY — DX: Insomnia, unspecified: G47.00

## 2020-11-19 NOTE — ED Triage Notes (Signed)
Pt states he had a migraine last night and this happens when he doesn't sleep well. Says he ran out of sleep medication but was able to obtain a refill before coming in today. States he needs a work note.

## 2020-11-19 NOTE — ED Provider Notes (Signed)
[facility]   MRN: [id number] DOB: 1996/01/04  Subjective:   Douglas Poole is a 25 y.o. male presenting for recurrent migraine last week.  Patient did not have his usual medication as he ran out.  He refilled them today and took it.  He is doing much better.  Currently has a very mild headache.  Denies fever, confusion, weakness, numbness or tingling, vision changes.  No runny or stuffy nose, sore throat, cough, chest pain, shortness of breath.  Patient needs a note for his work as he missed today.  Does not need medication refills as he refilled them this morning.  No current facility-administered medications for this encounter.  Current Outpatient Medications:  .  amitriptyline (ELAVIL) 25 MG tablet, Take 1 tablet (25 mg total) by mouth at bedtime., Disp: 90 tablet, Rfl: 2 .  Melatonin 10 MG TABS, Take 10 mg by mouth at bedtime., Disp: 30 tablet, Rfl: 0 .  prochlorperazine (COMPAZINE) 5 MG tablet, Take 1 tablet (5 mg total) by mouth every 6 (six) hours as needed for nausea or vomiting., Disp: 30 tablet, Rfl: 0 .  SUMAtriptan (IMITREX) 100 MG tablet, Take 1 tablet (100 mg total) by mouth every 2 (two) hours as needed for migraine. May repeat in 2 hours if headache persists or recurs., Disp: 10 tablet, Rfl: 5   No Known Allergies  Past Medical History:  Diagnosis Date  . Insomnia   . No known health problems      Past Surgical History:  Procedure Laterality Date  . OTHER SURGICAL HISTORY     25 years old, repaired injury of L middle and ring finger    Family History  Problem Relation Age of Onset  . Lung cancer Maternal Grandfather   . Colon cancer Other   . Heart disease Neg Hx     Social History   Tobacco Use  . Smoking status: Never  . Smokeless tobacco: Never  Vaping Use  . Vaping Use: Every day  Substance Use Topics  . Alcohol use: No  . Drug use: Yes    Types: Marijuana    ROS   Objective:   Vitals: BP 128/74 (BP Location: Left Arm)    Pulse 77   Temp 98 F (36.7 C) (Oral)   Resp 18   SpO2 97%   Physical Exam Constitutional:      General: He is not in acute distress.    Appearance: Normal appearance. He is well-developed and normal weight. He is not ill-appearing, toxic-appearing or diaphoretic.  HENT:     Head: Normocephalic and atraumatic.     Right Ear: Tympanic membrane, ear canal and external ear normal. There is no impacted cerumen.     Left Ear: Tympanic membrane, ear canal and external ear normal. There is no impacted cerumen.     Nose: Nose normal. No congestion or rhinorrhea.     Mouth/Throat:     Mouth: Mucous membranes are moist.     Pharynx: Oropharynx is clear. No oropharyngeal exudate or posterior oropharyngeal erythema.  Eyes:     General: No scleral icterus.       Right eye: No discharge.        Left eye: No discharge.     Extraocular Movements: Extraocular movements intact.     Conjunctiva/sclera: Conjunctivae normal.     Pupils: Pupils are equal, round, and reactive to light.  Cardiovascular:     Rate and Rhythm: Normal rate.  Pulmonary:     Effort:  Pulmonary effort is normal.  Musculoskeletal:     Cervical back: Normal range of motion and neck supple. No rigidity. No muscular tenderness.  Skin:    General: Skin is warm and dry.  Neurological:     General: No focal deficit present.     Mental Status: He is alert and oriented to person, place, and time.     Cranial Nerves: No cranial nerve deficit.     Motor: No weakness.     Coordination: Coordination normal.     Gait: Gait normal.     Deep Tendon Reflexes: Reflexes normal.  Psychiatric:        Mood and Affect: Mood normal.        Behavior: Behavior normal.        Thought Content: Thought content normal.        Judgment: Judgment normal.    Assessment and Plan :   PDMP not reviewed this encounter.  1. Other migraine without status migrainosus, not intractable     Migraine is controlled. No refills needed. Note for work  provided. Counseled patient on potential for adverse effects with medications prescribed/recommended today, ER and return-to-clinic precautions discussed, patient verbalized understanding.    Wallis Bamberg, [place] 11/19/20 1645

## 2020-11-19 NOTE — Telephone Encounter (Signed)
Pt called in needed appointment for today but there were no opening and I checked around for a couple other locations but they were full also.

## 2021-01-19 ENCOUNTER — Ambulatory Visit
Admission: EM | Admit: 2021-01-19 | Discharge: 2021-01-19 | Disposition: A | Discharge status: Home / Self Care | Payer: 59 | Attending: Urgent Care | Admitting: Urgent Care

## 2021-01-19 ENCOUNTER — Encounter: Payer: Self-pay | Admitting: Emergency Medicine

## 2021-01-19 DIAGNOSIS — Z8669 Personal history of other diseases of the nervous system and sense organs: Secondary | ICD-10-CM

## 2021-01-19 DIAGNOSIS — R519 Headache, unspecified: Secondary | ICD-10-CM | POA: Diagnosis not present

## 2021-01-19 MED ORDER — EXCEDRIN MIGRAINE 250-250-65 MG PO TABS: 1.0000 | ORAL_TABLET | Freq: Four times a day (QID) | ORAL | 0 refills | Status: AC | PRN

## 2021-01-19 NOTE — ED Triage Notes (Signed)
Pt here with headache x 2 days. Needs work note.

## 2021-01-19 NOTE — ED Provider Notes (Signed)
[facility]   MRN: [id number] DOB: 1995-08-31  Subjective:   CORNELIO PARKERSON is a 25 y.o. male presenting for 2 day history of acute onset mild-moderate intermittent right sided headache.  No weakness, photophobia, numbness or tingling, vision changes, runny or stuffy nose, sore throat, cough, body aches.  Does not want testing for COVID or flu.  Needs a note for his employer.  Has a history of migraines but this one is not as bad as his typical migraines.  Those are usually different form.  No current facility-administered medications for this encounter.  Current Outpatient Medications:    amitriptyline (ELAVIL) 25 MG tablet, Take 1 tablet (25 mg total) by mouth at bedtime., Disp: 90 tablet, Rfl: 2   Melatonin 10 MG TABS, Take 10 mg by mouth at bedtime., Disp: 30 tablet, Rfl: 0   prochlorperazine (COMPAZINE) 5 MG tablet, Take 1 tablet (5 mg total) by mouth every 6 (six) hours as needed for nausea or vomiting., Disp: 30 tablet, Rfl: 0   SUMAtriptan (IMITREX) 100 MG tablet, Take 1 tablet (100 mg total) by mouth every 2 (two) hours as needed for migraine. May repeat in 2 hours if headache persists or recurs., Disp: 10 tablet, Rfl: 5   No Known Allergies  Past Medical History:  Diagnosis Date   Insomnia    No known health problems      Past Surgical History:  Procedure Laterality Date   OTHER SURGICAL HISTORY     25 years old, repaired injury of L middle and ring finger    Family History  Problem Relation Age of Onset   Lung cancer Maternal Grandfather    Colon cancer Other    Heart disease Neg Hx     Social History   Tobacco Use   Smoking status: Never   Smokeless tobacco: Never  Vaping Use   Vaping Use: Every day  Substance Use Topics   Alcohol use: No   Drug use: Yes    Types: Marijuana    ROS   Objective:   Vitals: BP 122/77 (BP Location: Left Arm)   Pulse 88   Temp 99.1 F (37.3 C) (Oral)   Resp 18   SpO2 97%   Physical  Exam Constitutional:      General: He is not in acute distress.    Appearance: Normal appearance. He is well-developed and normal weight. He is not ill-appearing, toxic-appearing or diaphoretic.  HENT:     Head: Normocephalic and atraumatic.     Right Ear: Tympanic membrane, ear canal and external ear normal. There is no impacted cerumen.     Left Ear: Tympanic membrane, ear canal and external ear normal. There is no impacted cerumen.     Nose: Nose normal. No congestion or rhinorrhea.     Mouth/Throat:     Mouth: Mucous membranes are moist.     Pharynx: Oropharynx is clear. No oropharyngeal exudate or posterior oropharyngeal erythema.  Eyes:     General: No scleral icterus.       Right eye: No discharge.        Left eye: No discharge.     Extraocular Movements: Extraocular movements intact.     Conjunctiva/sclera: Conjunctivae normal.     Pupils: Pupils are equal, round, and reactive to light.  Cardiovascular:     Rate and Rhythm: Normal rate.  Pulmonary:     Effort: Pulmonary effort is normal.  Musculoskeletal:     Cervical back: Normal range of motion and  neck supple. No rigidity. No muscular tenderness.  Neurological:     General: No focal deficit present.     Mental Status: He is alert and oriented to person, place, and time.     Cranial Nerves: No cranial nerve deficit, dysarthria or facial asymmetry.     Sensory: No sensory deficit.     Motor: No weakness.     Coordination: Romberg sign negative. Coordination normal.     Gait: Gait normal.     Deep Tendon Reflexes: Reflexes normal.  Psychiatric:        Mood and Affect: Mood normal.        Behavior: Behavior normal.        Thought Content: Thought content normal.        Judgment: Judgment normal.    Assessment and Plan :   PDMP not reviewed this encounter.  1. Right-sided headache   2. History of migraine    No signs of an acute encephalopathy.  Recommended conservative management, use of Excedrin for generalized  headaches.  Patient declined testing.  Counseled patient on potential for adverse effects with medications prescribed/recommended today, ER and return-to-clinic precautions discussed, patient verbalized understanding.    Wallis Bamberg, [place] 01/19/21 1530

## 2021-01-27 ENCOUNTER — Encounter: Payer: 59 | Admitting: Family Medicine

## 2021-02-11 ENCOUNTER — Encounter: Payer: 59 | Admitting: Family Medicine

## 2021-05-17 ENCOUNTER — Other Ambulatory Visit: Payer: Self-pay

## 2021-05-17 ENCOUNTER — Ambulatory Visit
Admission: EM | Admit: 2021-05-17 | Discharge: 2021-05-17 | Disposition: A | Discharge status: Home / Self Care | Payer: Self-pay

## 2021-05-17 ENCOUNTER — Encounter: Payer: Self-pay | Admitting: Emergency Medicine

## 2021-05-17 DIAGNOSIS — Z0289 Encounter for other administrative examinations: Secondary | ICD-10-CM

## 2021-05-17 NOTE — ED Provider Notes (Signed)
?[facility] ? ? ? ?CSN: [id number] ?Arrival date & time: 05/17/21  0900 ? ? ?  ? ?History   ?Chief Complaint ?Chief Complaint  ?Patient presents with  ? Nausea  ? Emesis  ? Diarrhea  ? ? ?HPI ?Douglas TRICKEL is a 26 y.o. male.  ? ?HPI ?Patient with a history of chronic migraines and chronic nausea with occasional vomiting developed onset of symptoms yesterday.  He is here just for work note to return back to work today as he was absent from work yesterday.  His symptoms have subsequently resolved and he has medication at home for management of symptoms if they return.  Patient is ready to return to work tomorrow.  His vital signs are stable. ? ?Past Medical History:  ?Diagnosis Date  ? Insomnia   ? No known health problems   ? ? ?Patient Active Problem List  ? Diagnosis Date Noted  ? Migraine with aura and without status migrainosus, not intractable 07/28/2020  ? Insomnia 07/28/2020  ? Acne 12/13/2012  ? ? ?Past Surgical History:  ?Procedure Laterality Date  ? OTHER SURGICAL HISTORY    ? 26 years old, repaired injury of L middle and ring finger  ? ? ? ? ? ?Home Medications   ? ?Prior to Admission medications   ?Medication Sig Start Date End Date Taking? Authorizing Provider  ?amitriptyline (ELAVIL) 25 MG tablet Take 1 tablet (25 mg total) by mouth at bedtime. 07/28/20   Sharlene Dory, DO  ?aspirin-acetaminophen-caffeine (EXCEDRIN MIGRAINE) [phone] MG tablet Take 1 tablet by mouth every 6 (six) hours as needed for headache. 01/19/21   Wallis Bamberg, PA-C  ?Melatonin 10 MG TABS Take 10 mg by mouth at bedtime. 06/05/20   Gailen Shelter, PA  ?prochlorperazine (COMPAZINE) 5 MG tablet Take 1 tablet (5 mg total) by mouth every 6 (six) hours as needed for nausea or vomiting. 06/18/20   Sharlene Dory, DO  ?SUMAtriptan (IMITREX) 100 MG tablet Take 1 tablet (100 mg total) by mouth every 2 (two) hours as needed for migraine. May repeat in 2 hours if headache persists or recurs. 07/28/20   Sharlene Dory, DO  ? ? ?Family History ?Family History  ?Problem Relation Age of Onset  ? Lung cancer Maternal Grandfather   ? Colon cancer Other   ? Heart disease Neg Hx   ? ? ?Social History ?Social History  ? ?Tobacco Use  ? Smoking status: Never  ? Smokeless tobacco: Never  ?Vaping Use  ? Vaping Use: Every day  ?Substance Use Topics  ? Alcohol use: No  ? Drug use: Yes  ?  Types: Marijuana  ? ? ? ?Allergies   ?Patient has no known allergies. ? ? ?Review of Systems ?Review of Systems ?Pertinent negatives listed in HPI  ? ?Physical Exam ?Triage Vital Signs ?ED Triage Vitals  ?Enc Vitals Group  ?   BP 05/17/21 0918 135/77  ?   Pulse Rate 05/17/21 0918 67  ?   Resp 05/17/21 0918 16  ?   Temp 05/17/21 0918 98.6 ?F (37 ?C)  ?   Temp Source 05/17/21 0918 Oral  ?   SpO2 05/17/21 0918 96 %  ?   Weight --   ?   Height --   ?   Head Circumference --   ?   Peak Flow --   ?   Pain Score 05/17/21 0917 0  ?   Pain Loc --   ?   Pain  Edu? --   ?   Excl. in GC? --   ? ?No data found. ? ?Updated Vital Signs ?BP 135/77 (BP Location: Left Arm)   Pulse 67   Temp 98.6 ?F (37 ?C) (Oral)   Resp 16   SpO2 96%  ? ?Visual Acuity ?Right Eye Distance:   ?Left Eye Distance:   ?Bilateral Distance:   ? ?Right Eye Near:   ?Left Eye Near:    ?Bilateral Near:    ? ?Physical Exam ?Constitutional:   ?   Appearance: Normal appearance.  ?HENT:  ?   Head: Normocephalic.  ?Eyes:  ?   Extraocular Movements: Extraocular movements intact.  ?   Pupils: Pupils are equal, round, and reactive to light.  ?Cardiovascular:  ?   Rate and Rhythm: Normal rate and regular rhythm.  ?Pulmonary:  ?   Effort: Pulmonary effort is normal.  ?   Breath sounds: Normal breath sounds.  ?Skin: ?   General: Skin is warm.  ?   Capillary Refill: Capillary refill takes less than 2 seconds.  ?Neurological:  ?   General: No focal deficit present.  ?   Mental Status: He is alert.  ?Psychiatric:     ?   Mood and Affect: Mood normal.  ? ? ? ?UC Treatments / Results  ?Labs ?(all labs  ordered are listed, but only abnormal results are displayed) ?Labs Reviewed - No data to display ? ?EKG ? ? ?Radiology ?No results found. ? ?Procedures ?Procedures (including critical care time) ? ?Medications Ordered in UC ?Medications - No data to display ? ?Initial Impression / Assessment and Plan / UC Course  ?I have reviewed the triage vital signs and the nursing notes. ? ?Pertinent labs & imaging results that were available during my care of the patient were reviewed by me and considered in my medical decision making (see chart for details). ? ?  ?Patient presents today for a work note to return back to work [employer].  Patient symptoms have resolved.  Vital signs stable.  Medically cleared return to work ?Final Clinical Impressions(s) / UC Diagnoses  ? ?Final diagnoses:  ?Encounter to obtain excuse from work  ? ?Discharge Instructions   ?None ?  ? ?ED Prescriptions   ?None ?  ? ?PDMP not reviewed this encounter. ?  ?Bing Neighbors, FNP ?05/17/21 [phone] ? ?

## 2021-05-17 NOTE — ED Triage Notes (Signed)
Pt experienced vomiting, diarrhea and nausea yesterday and was unable to go to work. Pt needs to a note to return to work. ?

## 2021-05-27 ENCOUNTER — Ambulatory Visit
Admission: EM | Admit: 2021-05-27 | Discharge: 2021-05-27 | Disposition: A | Discharge status: Home / Self Care | Payer: 59

## 2021-05-27 ENCOUNTER — Encounter: Payer: Self-pay | Admitting: Emergency Medicine

## 2021-05-27 DIAGNOSIS — G43009 Migraine without aura, not intractable, without status migrainosus: Secondary | ICD-10-CM

## 2021-05-27 NOTE — ED Provider Notes (Signed)
?[facility] ? ? ? ?CSN: [id number] ?Arrival date & time: 05/27/21  1846 ? ? ?  ? ?History   ?Chief Complaint ?Chief Complaint  ?Patient presents with  ? Migraine  ? ? ?HPI ?Douglas Poole is a 26 y.o. male.  ? ?Pleasant 25yo male patient with a known hx of migraines presents today due to having had a migraine last evening. He reports having taken a sumatriptan late in the evening, which eventually caused his migraine to resolve. He is here today primarily needing a work note. He works night shift at [employer] and was unable to go in last evening. He tried to sleep today but reports bad sleep. He notes his migraines are triggered by an erratic sleep cycle. He is currently asymptomatic in our office. Denies need for a refill and denies any current symptoms.  ? ? ?Migraine ? ? ?Past Medical History:  ?Diagnosis Date  ? Insomnia   ? No known health problems   ? ? ?Patient Active Problem List  ? Diagnosis Date Noted  ? Migraine with aura and without status migrainosus, not intractable 07/28/2020  ? Insomnia 07/28/2020  ? Acne 12/13/2012  ? ? ?Past Surgical History:  ?Procedure Laterality Date  ? OTHER SURGICAL HISTORY    ? 26 years old, repaired injury of L middle and ring finger  ? ? ? ? ? ?Home Medications   ? ?Prior to Admission medications   ?Medication Sig Start Date End Date Taking? Authorizing Provider  ?amitriptyline (ELAVIL) 25 MG tablet Take 1 tablet (25 mg total) by mouth at bedtime. 07/28/20  Yes Sharlene Dory, DO  ?aspirin-acetaminophen-caffeine (EXCEDRIN MIGRAINE) [phone] MG tablet Take 1 tablet by mouth every 6 (six) hours as needed for headache. 01/19/21  Yes Wallis Bamberg, PA-C  ?SUMAtriptan (IMITREX) 100 MG tablet Take 1 tablet (100 mg total) by mouth every 2 (two) hours as needed for migraine. May repeat in 2 hours if headache persists or recurs. 07/28/20  Yes Sharlene Dory, DO  ?Melatonin 10 MG TABS Take 10 mg by mouth at bedtime. 06/05/20   Gailen Shelter, PA   ?prochlorperazine (COMPAZINE) 5 MG tablet Take 1 tablet (5 mg total) by mouth every 6 (six) hours as needed for nausea or vomiting. 06/18/20   Sharlene Dory, DO  ? ? ?Family History ?Family History  ?Problem Relation Age of Onset  ? Lung cancer Maternal Grandfather   ? Colon cancer Other   ? Heart disease Neg Hx   ? ? ?Social History ?Social History  ? ?Tobacco Use  ? Smoking status: Never  ? Smokeless tobacco: Never  ?Vaping Use  ? Vaping Use: Every day  ?Substance Use Topics  ? Alcohol use: No  ? Drug use: Yes  ?  Types: Marijuana  ? ? ? ?Allergies   ?Patient has no known allergies. ? ? ?Review of Systems ?Review of Systems  ?All other systems reviewed and are negative. ? ? ?Physical Exam ?Triage Vital Signs ?ED Triage Vitals  ?Enc Vitals Group  ?   BP 05/27/21 1931 (!) 144/75  ?   Pulse Rate 05/27/21 1931 83  ?   Resp 05/27/21 1931 18  ?   Temp 05/27/21 1931 98.2 ?F (36.8 ?C)  ?   Temp src --   ?   SpO2 05/27/21 1931 97 %  ?   Weight --   ?   Height --   ?   Head Circumference --   ?   Peak  Flow --   ?   Pain Score 05/27/21 1939 6  ?   Pain Loc --   ?   Pain Edu? --   ?   Excl. in GC? --   ? ?No data found. ? ?Updated Vital Signs ?BP (!) 144/75   Pulse 83   Temp 98.2 ?F (36.8 ?C)   Resp 18   SpO2 97%  ? ?Visual Acuity ?Right Eye Distance:   ?Left Eye Distance:   ?Bilateral Distance:   ? ?Right Eye Near:   ?Left Eye Near:    ?Bilateral Near:    ? ?Physical Exam ?Vitals and nursing note reviewed.  ?Constitutional:   ?   General: He is not in acute distress. ?   Appearance: Normal appearance. He is normal weight. He is not ill-appearing or toxic-appearing.  ?HENT:  ?   Head: Normocephalic and atraumatic.  ?   Right Ear: Tympanic membrane, ear canal and external ear normal. There is no impacted cerumen.  ?   Left Ear: Tympanic membrane, ear canal and external ear normal. There is no impacted cerumen.  ?   Nose: Nose normal. No congestion or rhinorrhea.  ?   Mouth/Throat:  ?   Mouth: Mucous membranes are  moist.  ?   Pharynx: Oropharynx is clear. No oropharyngeal exudate or posterior oropharyngeal erythema.  ?Eyes:  ?   General: No visual field deficit or scleral icterus.    ?   Right eye: No discharge.     ?   Left eye: No discharge.  ?   Extraocular Movements: Extraocular movements intact.  ?   Conjunctiva/sclera: Conjunctivae normal.  ?   Pupils: Pupils are equal, round, and reactive to light.  ?Cardiovascular:  ?   Rate and Rhythm: Normal rate and regular rhythm.  ?   Pulses: Normal pulses.  ?   Heart sounds: No murmur heard. ?Pulmonary:  ?   Effort: Pulmonary effort is normal. No respiratory distress.  ?   Breath sounds: Normal breath sounds. No stridor. No rhonchi.  ?Chest:  ?   Chest wall: No tenderness.  ?Musculoskeletal:     ?   General: No swelling, tenderness, deformity or signs of injury. Normal range of motion.  ?   Cervical back: Normal range of motion and neck supple. No rigidity or tenderness.  ?   Right lower leg: No edema.  ?Lymphadenopathy:  ?   Cervical: No cervical adenopathy.  ?Skin: ?   General: Skin is warm.  ?   Coloration: Skin is not jaundiced.  ?   Findings: No bruising, erythema, lesion or rash.  ?Neurological:  ?   General: No focal deficit present.  ?   Mental Status: He is alert and oriented to person, place, and time. Mental status is at baseline.  ?   Cranial Nerves: Cranial nerves 2-12 are intact. No cranial nerve deficit, dysarthria or facial asymmetry.  ?   Sensory: Sensation is intact. No sensory deficit.  ?   Motor: Motor function is intact. No weakness, tremor, atrophy, abnormal muscle tone or pronator drift.  ?   Coordination: Coordination is intact. Romberg sign negative. Coordination normal. Finger-Nose-Finger Test normal.  ?   Deep Tendon Reflexes: Reflexes are normal and symmetric.  ?Psychiatric:     ?   Mood and Affect: Mood normal.     ?   Behavior: Behavior normal.     ?   Thought Content: Thought content normal.     ?   Judgment:  Judgment normal.  ? ? ? ?UC Treatments  / Results  ?Labs ?(all labs ordered are listed, but only abnormal results are displayed) ?Labs Reviewed - No data to display ? ?EKG ? ? ?Radiology ?No results found. ? ?Procedures ?Procedures (including critical care time) ? ?Medications Ordered in UC ?Medications - No data to display ? ?Initial Impression / Assessment and Plan / UC Course  ?I have reviewed the triage vital signs and the nursing notes. ? ?Pertinent labs & imaging results that were available during my care of the patient were reviewed by me and considered in my medical decision making (see chart for details). ? ?  ? ?Migraine - resolved in office. Pt primarily needing a work note. Does not need medications refilled either. Will f/u with PCP if sx return or stop responding to usual medications. ? ?Final Clinical Impressions(s) / UC Diagnoses  ? ?Final diagnoses:  ?Migraine without aura and without status migrainosus, not intractable  ? ? ? ?Discharge Instructions   ? ?  ?We are glad you are starting to feel better. ?Continue taking your medications as prescribed to prevent migraines. ?Consider talking with your PCP about switching from sumatriptan to Rizatriptan - this medication dissolves under the tongue and can resolve a migraine much faster than your current Rx. ?Try to eat and sleep on a routine, consistent basis as this can help prevent migraines. ?Follow up with PCP or return to clinic should symptoms persist. ? ? ? ?ED Prescriptions   ?None ?  ? ?PDMP not reviewed this encounter. ?  ?Maretta BeesCrain, Dayveon Halley L, [place] ?05/27/21 2025 ? ?

## 2021-05-27 NOTE — Discharge Instructions (Signed)
We are glad you are starting to feel better. ?Continue taking your medications as prescribed to prevent migraines. ?Consider talking with your PCP about switching from sumatriptan to Rizatriptan - this medication dissolves under the tongue and can resolve a migraine much faster than your current Rx. ?Try to eat and sleep on a routine, consistent basis as this can help prevent migraines. ?Follow up with PCP or return to clinic should symptoms persist. ?

## 2021-05-27 NOTE — ED Triage Notes (Signed)
Pt presents with a migraine since last night ?

## 2021-08-18 IMAGING — DX DG CHEST 2V
2 series · 2 of 2 positions shown · non-contrast
Comparison: Report from chest radiograph dated 02/23/1998.

CLINICAL DATA: Chest pain and vomiting

EXAM:
CHEST - 2 VIEW

[chest lat]
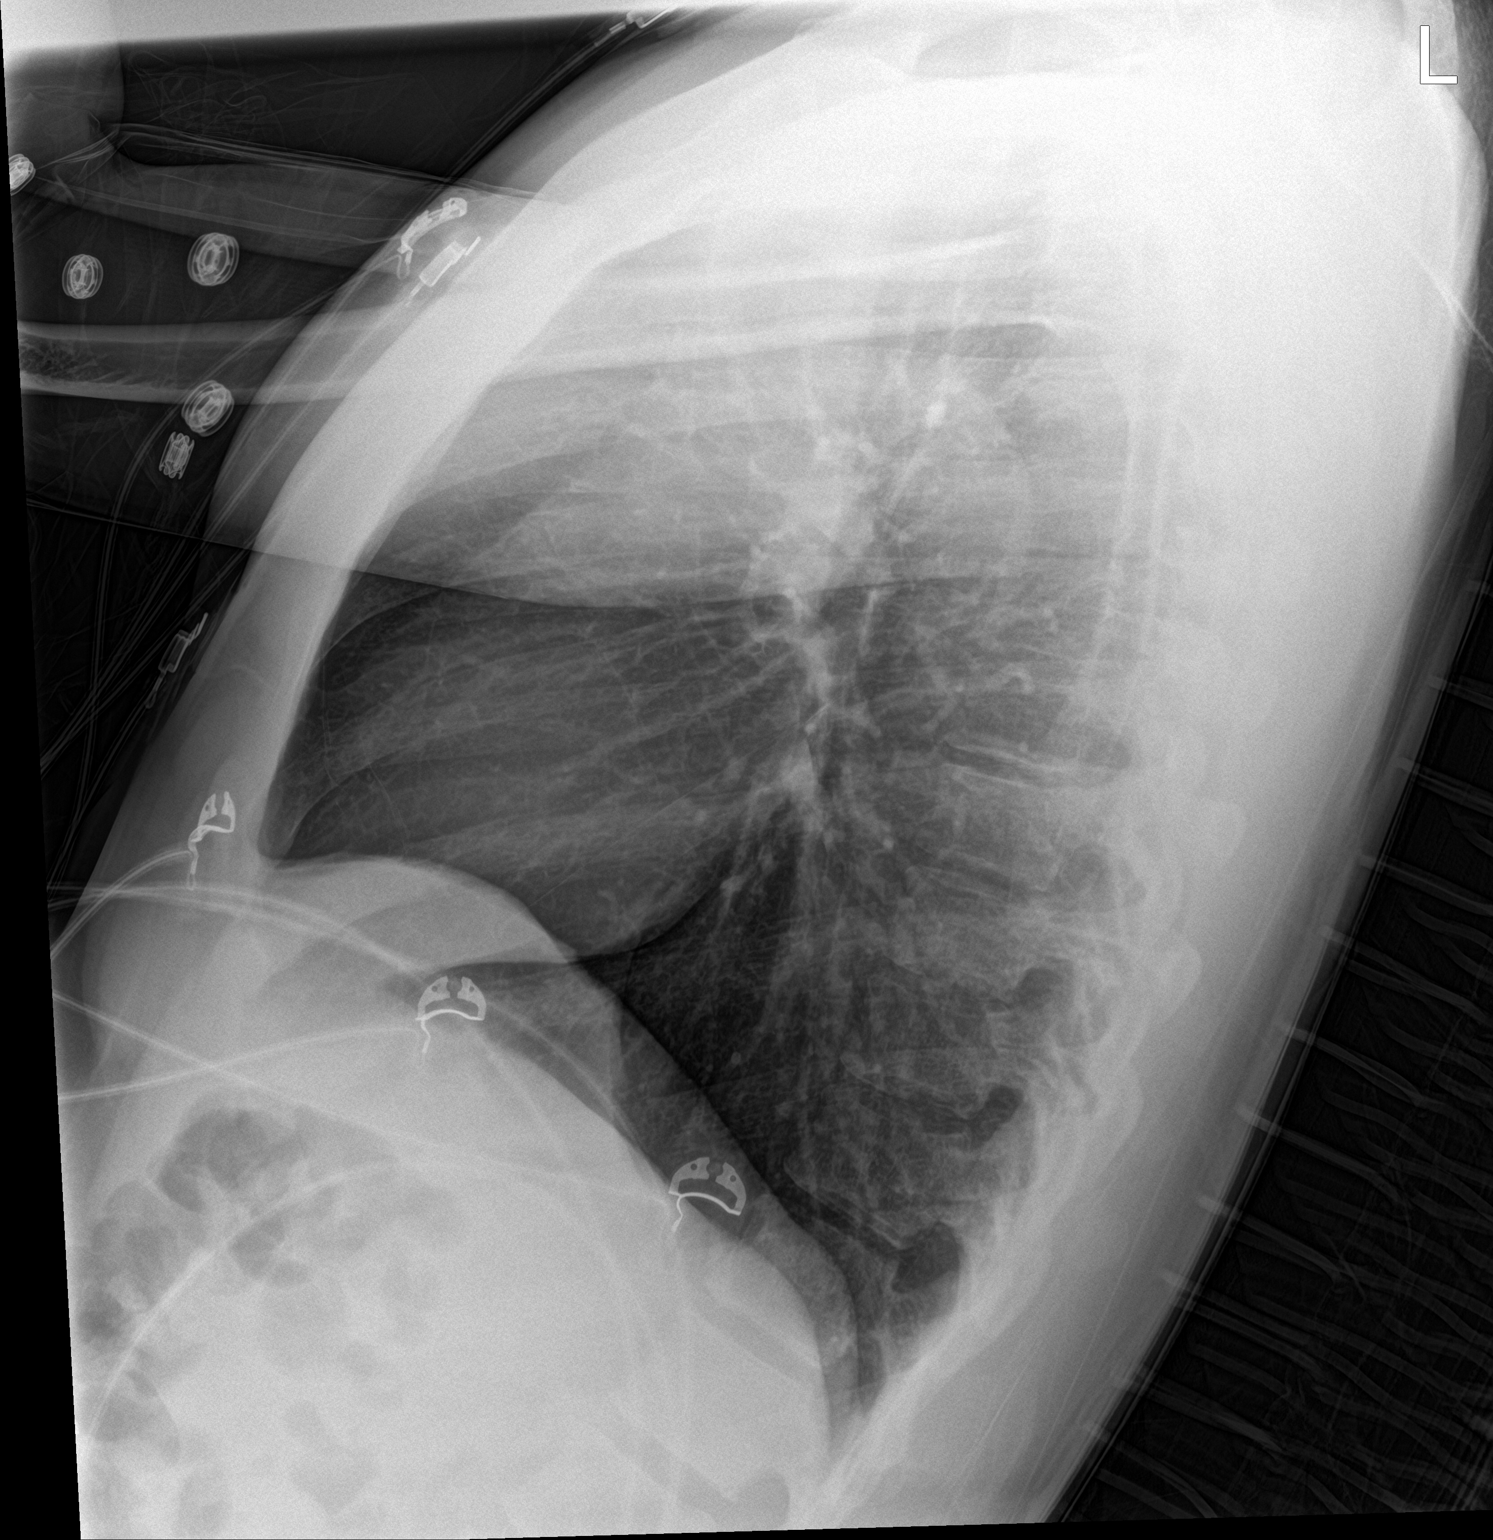

[chest ap]
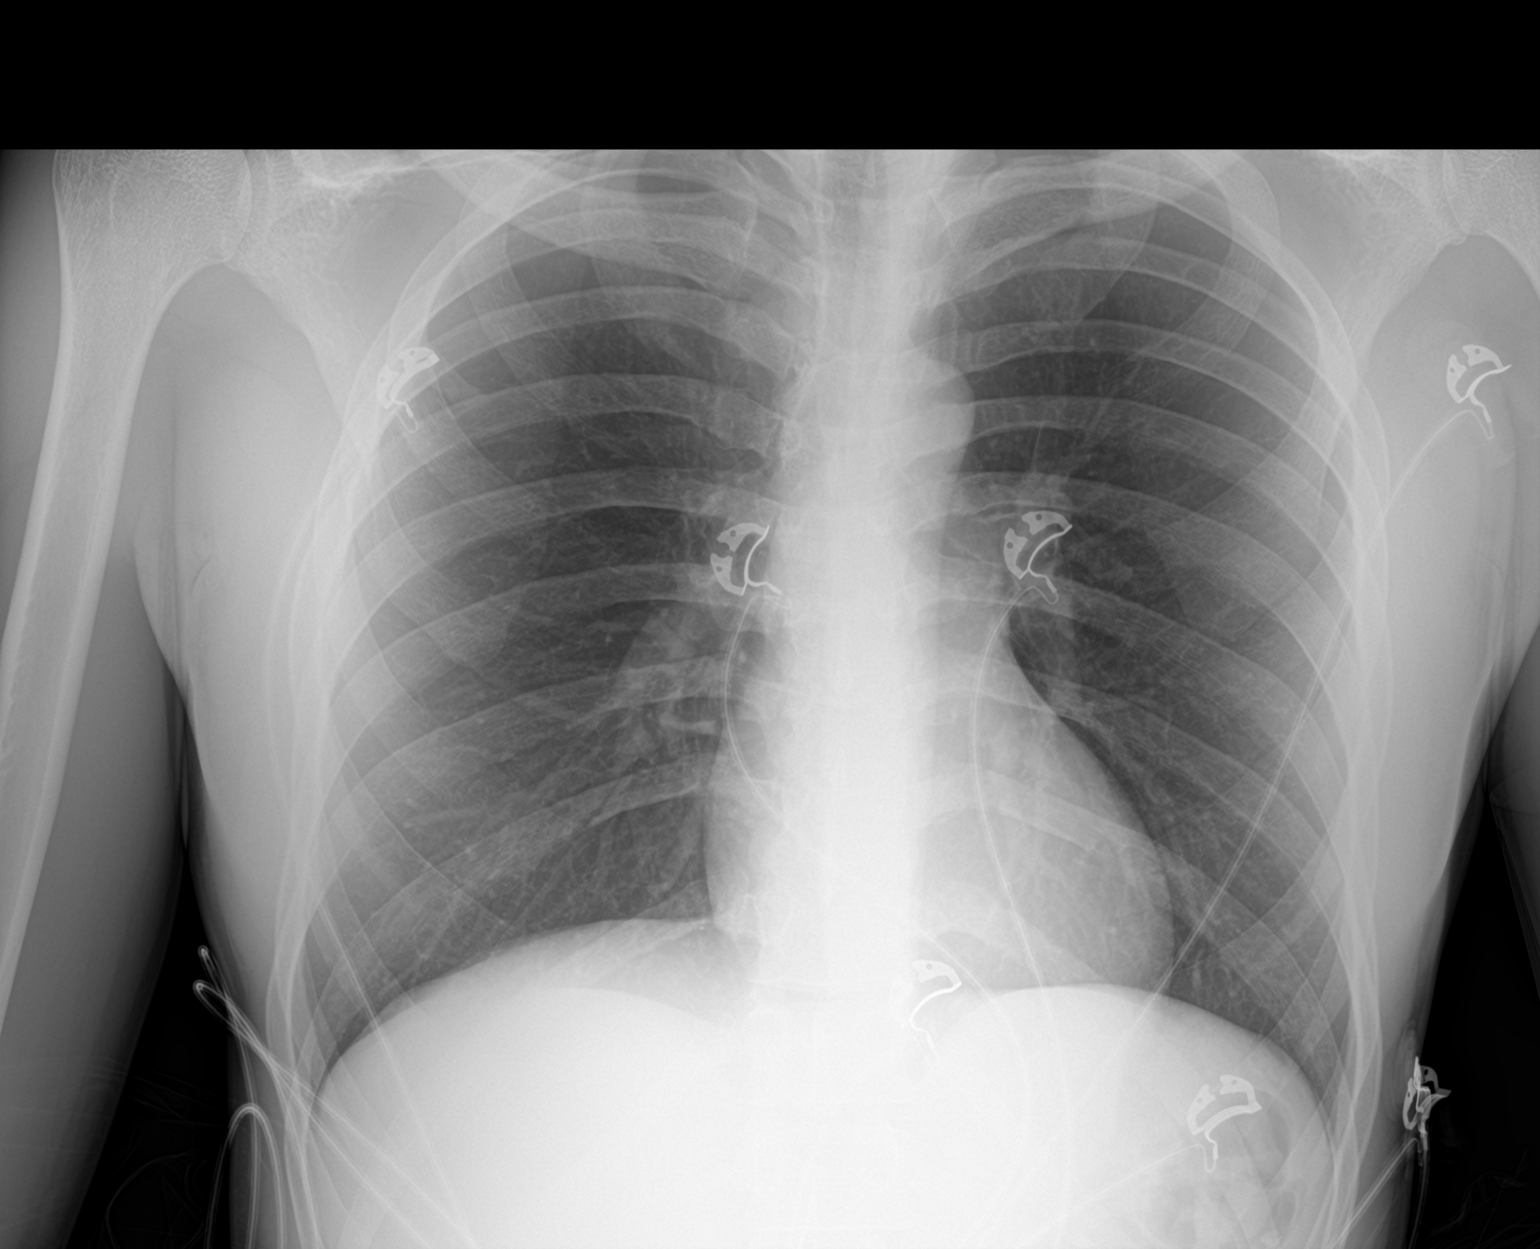

[2 of 2 positions shown; findings below may reference images not displayed]

FINDINGS: The heart size and mediastinal contours are within normal limits.
Both lungs are clear. The visualized skeletal structures are
unremarkable.
IMPRESSION: No active cardiopulmonary disease.

## 2022-12-06 ENCOUNTER — Ambulatory Visit
Admission: EM | Admit: 2022-12-06 | Discharge: 2022-12-06 | Disposition: A | Discharge status: Home / Self Care | Payer: Self-pay | Attending: Internal Medicine | Admitting: Internal Medicine

## 2022-12-06 DIAGNOSIS — N342 Other urethritis: Secondary | ICD-10-CM | POA: Insufficient documentation

## 2022-12-06 MED ORDER — CEFTRIAXONE SODIUM 500 MG IJ SOLR
500.0000 mg | INTRAMUSCULAR | Status: DC
  Administered 2022-12-06: 500 mg via INTRAMUSCULAR

## 2022-12-06 MED ORDER — DOXYCYCLINE HYCLATE 100 MG PO CAPS: 100.0000 mg | ORAL_CAPSULE | Freq: Two times a day (BID) | ORAL | 0 refills | Status: DC

## 2022-12-06 NOTE — ED Provider Notes (Signed)
[facility]  Note:  This document was prepared using Conservation officer, historic buildings and may include unintentional dictation errors.  MRN: [id number] DOB: [birth year]  Subjective:   Douglas Poole is a 27 y.o. male presenting for 1 day history of penile discharge. Has sex with 1 male partner, unprotected sex. Denies dysuria, hematuria, urinary frequency, penile swelling, testicular pain, testicular swelling, anal pain, groin pain.   No current facility-administered medications for this encounter.  Current Outpatient Medications:    amitriptyline (ELAVIL) 25 MG tablet, Take 1 tablet (25 mg total) by mouth at bedtime., Disp: 90 tablet, Rfl: 2   aspirin-acetaminophen-caffeine (EXCEDRIN MIGRAINE) 250-250-65 MG tablet, Take 1 tablet by mouth every 6 (six) hours as needed for headache., Disp: 30 tablet, Rfl: 0   Melatonin 10 MG TABS, Take 10 mg by mouth at bedtime., Disp: 30 tablet, Rfl: 0   prochlorperazine (COMPAZINE) 5 MG tablet, Take 1 tablet (5 mg total) by mouth every 6 (six) hours as needed for nausea or vomiting., Disp: 30 tablet, Rfl: 0   SUMAtriptan (IMITREX) 100 MG tablet, Take 1 tablet (100 mg total) by mouth every 2 (two) hours as needed for migraine. May repeat in 2 hours if headache persists or recurs., Disp: 10 tablet, Rfl: 5   No Known Allergies  Past Medical History:  Diagnosis Date   Insomnia    No known health problems      Past Surgical History:  Procedure Laterality Date   OTHER SURGICAL HISTORY     27 years old, repaired injury of L middle and ring finger    Family History  Problem Relation Age of Onset   Lung cancer Maternal Grandfather    Colon cancer Other    Heart disease Neg Hx     Social History   Tobacco Use   Smoking status: Never   Smokeless tobacco: Never  Vaping Use   Vaping status: Every Day  Substance Use Topics   Alcohol use: No   Drug use: Yes    Types: Marijuana    ROS   Objective:   Vitals: BP  119/81 (BP Location: Right Arm)   Pulse 64   Temp 97.9 F (36.6 C) (Oral)   Resp 16   SpO2 96%   Physical Exam Constitutional:      General: He is not in acute distress.    Appearance: Normal appearance. He is well-developed and normal weight. He is not ill-appearing, toxic-appearing or diaphoretic.  HENT:     Head: Normocephalic and atraumatic.     Right Ear: External ear normal.     Left Ear: External ear normal.     Nose: Nose normal.     Mouth/Throat:     Pharynx: Oropharynx is clear.  Eyes:     General: No scleral icterus.       Right eye: No discharge.        Left eye: No discharge.     Extraocular Movements: Extraocular movements intact.  Cardiovascular:     Rate and Rhythm: Normal rate.  Pulmonary:     Effort: Pulmonary effort is normal.  Genitourinary:    Penis: Circumcised. Discharge present. No phimosis, paraphimosis, hypospadias, erythema, tenderness, swelling or lesions.   Musculoskeletal:     Cervical back: Normal range of motion.  Neurological:     Mental Status: He is alert and oriented to person, place, and time.  Psychiatric:        Mood and Affect: Mood normal.  Behavior: Behavior normal.        Thought Content: Thought content normal.        Judgment: Judgment normal.     Assessment and Plan :   PDMP not reviewed this encounter.  1. Urethritis    Patient treated empirically as per CDC guidelines with IM ceftriaxone, doxycycline as an outpatient.  Labs pending.   Counseled on safe sex practices including abstaining for 1 week following treatment.  Counseled patient on potential for adverse effects with medications prescribed/recommended today, ER and return-to-clinic precautions discussed, patient verbalized understanding.     Wallis Bamberg, PA-C 12/06/22 1020

## 2022-12-06 NOTE — ED Triage Notes (Signed)
Pt requesting STD testing-penile discharge started yesterday-NAD-steady gait

## 2022-12-06 NOTE — Discharge Instructions (Addendum)
Avoid all forms of sexual intercourse (oral, vaginal, anal) for the next 7 days to avoid spreading/reinfecting or at least until we can see what kinds of infection results are positive.  Abstaining for 2 weeks would be better but at least 1 week is required.  We will let you know about your test results from the swab we did today and if you need any prescriptions for antibiotics or changes to your treatment from today.  We will call you with your test results tomorrow. If you have not heard from [place] by 2pm then give our clinic a call and we will review with you.

## 2022-12-07 LAB — CYTOLOGY, (ORAL, ANAL, URETHRAL) ANCILLARY ONLY
Chlamydia: POSITIVE — AB
Comment: NEGATIVE
Comment: NEGATIVE
Comment: NORMAL
Neisseria Gonorrhea: NEGATIVE
Trichomonas: NEGATIVE

## 2023-02-03 ENCOUNTER — Ambulatory Visit
Admission: EM | Admit: 2023-02-03 | Discharge: 2023-02-03 | Disposition: A | Discharge status: Home / Self Care | Payer: Self-pay | Attending: Family Medicine | Admitting: Family Medicine

## 2023-02-03 DIAGNOSIS — G43019 Migraine without aura, intractable, without status migrainosus: Secondary | ICD-10-CM

## 2023-02-03 MED ORDER — KETOROLAC TROMETHAMINE 30 MG/ML IJ SOLN
60.0000 mg | Freq: Once | INTRAMUSCULAR | Status: AC
  Administered 2023-02-03: 60 mg via INTRAMUSCULAR

## 2023-02-03 MED ORDER — DEXAMETHASONE SODIUM PHOSPHATE 10 MG/ML IJ SOLN
10.0000 mg | Freq: Once | INTRAMUSCULAR | Status: AC
  Administered 2023-02-03: 10 mg via INTRAMUSCULAR

## 2023-02-03 NOTE — ED Provider Notes (Signed)
[facility]   [id number] 02/03/23 Arrival Time: 1557  ASSESSMENT & PLAN:  1. Intractable migraine without aura and without status migrainosus    Meds ordered this encounter  Medications   ketorolac (TORADOL) 30 MG/ML injection 60 mg   dexamethasone (DECADRON) injection 10 mg   Normal neurological exam. Afebrile without nuchal rigidity. Symptoms consistent with past headaches. No indication for urgent neurodiagnostic imaging.  Recommend:  Follow-up Information     Schedule an appointment as soon as possible for a visit  with Sharlene Dory, DO.   Specialty: Family Medicine Why: For follow up. Contact information: [address] [place] [address] [phone]                Reviewed expectations re: course of current medical issues. Questions answered. Outlined signs and symptoms indicating need for more acute intervention. Patient verbalized understanding. After Visit Summary given.   SUBJECTIVE: History from: Patient Patient is able to give a clear and coherent history.  Douglas Poole is a 27 y.o. male who presents with complaint of a migraine headache. Onset gradual, several days ago. Location: frontal without radiation. History of headaches: yes, with same symptoms. Precipitating factors include:  questions insomnia related . Associated symptoms: Preceding aura: no. Nausea/vomiting: nausea, mild, without emesis. Vision changes: denies. Increased sensitivity to light and to noises: yes. Fever: no. Sinus pressure/congestion: no. Extremity weakness: no. Home treatment has included sleeping with a little improvement. Current headache has limited normal daily activities. Has missed work. Denies dizziness, loss of balance, muscle weakness, and speech difficulties. No head injury reported. Ambulatory without difficulty. No recent travel.   OBJECTIVE:  Vitals:   02/03/23 1702 02/03/23 1705  BP:  120/76  Pulse:   72  Resp:  17  Temp:  98.5 F (36.9 C)  TempSrc:  Oral  SpO2:  97%  Weight: 72.1 kg   Height: 6\' 1"  (1.854 m)     General appearance: alert; NAD HENT: normocephalic; atraumatic Eyes: PERRLA; EOMI; conjunctivae normal Neck: supple with FROM Extremities: no edema; symmetrical with no gross deformities Skin: warm and dry Neurologic: alert; speech is fluent and clear without dysarthria or aphasia; CN 2-12 grossly intact; no facial droop; normal gait; normal symmetric reflexes; normal extremity strength and sensation throughout Psychological: alert and cooperative; normal mood and affect   No Known Allergies  Past Medical History:  Diagnosis Date   Insomnia    No known health problems    Social History   Socioeconomic History   Marital status: Single    Spouse name: Not on file   Number of children: Not on file   Years of education: Not on file   Highest education level: Not on file  Occupational History   Not on file  Tobacco Use   Smoking status: Every Day    Types: E-cigarettes   Smokeless tobacco: Never  Vaping Use   Vaping status: Every Day  Substance and Sexual Activity   Alcohol use: No   Drug use: Not Currently    Types: Marijuana   Sexual activity: Yes  Other Topics Concern   Not on file  Social History Narrative   Not on file   Social Drivers of Health   Financial Resource Strain: Not on file  Food Insecurity: Not on file  Transportation Needs: Not on file  Physical Activity: Not on file  Stress: Not on file  Social Connections: Not on file  Intimate Partner Violence: Not on file  Family History  Problem Relation Age of Onset   Lung cancer Maternal Grandfather    Colon cancer Other    Heart disease Neg Hx    Past Surgical History:  Procedure Laterality Date   OTHER SURGICAL HISTORY     27 years old, repaired injury of L middle and ring finger      Mardella Layman, MD 02/03/23 [phone]

## 2023-02-03 NOTE — Discharge Instructions (Signed)
Meds ordered this encounter  Medications   ketorolac (TORADOL) 30 MG/ML injection 60 mg   dexamethasone (DECADRON) injection 10 mg

## 2023-02-03 NOTE — ED Triage Notes (Signed)
Patient presents with migraine x 4/5 days. Treated with Tylenol and Excedrin. Need a work note.

## 2023-04-28 ENCOUNTER — Ambulatory Visit
Admission: RE | Admit: 2023-04-28 | Discharge: 2023-04-28 | Disposition: A | Discharge status: Home / Self Care | Payer: Self-pay | Source: Ambulatory Visit | Attending: Emergency Medicine | Admitting: Emergency Medicine

## 2023-04-28 VITALS — BP 106/69 | HR 70 | Temp 98.4°F | Resp 16

## 2023-04-28 DIAGNOSIS — Z113 Encounter for screening for infections with a predominantly sexual mode of transmission: Secondary | ICD-10-CM | POA: Diagnosis not present

## 2023-04-28 MED ORDER — DOXYCYCLINE HYCLATE 100 MG PO CAPS: 100.0000 mg | ORAL_CAPSULE | Freq: Two times a day (BID) | ORAL | 0 refills | Status: AC

## 2023-04-28 NOTE — ED Triage Notes (Signed)
 Pt states yellow penile discharge for the past 3 days.

## 2023-04-28 NOTE — Discharge Instructions (Signed)
 We will call you if anything on your swab returns positive. You can also see these results on MyChart. Please abstain from sexual intercourse until your results return.  I am treating you for chlamydia Start the doxycycline, twice daily always with food.  If by chance your swab comes back and it is not chlamydia, we will call you to please stop taking the antibiotic.

## 2023-04-28 NOTE — ED Provider Notes (Signed)
 [facility]    CSN: [id number] Arrival date & time: 04/28/23  4098      History   Chief Complaint Chief Complaint  Patient presents with   SEXUALLY TRANSMITTED DISEASE    Entered by patient    HPI Douglas Poole is a 28 y.o. male.  3-day history of yellow penile discharge He is sexually active, unprotected intercourse recently. Denies known exposure Not having dysuria or rash  Had chlamydia about 5 months ago, same symptom  Past Medical History:  Diagnosis Date   Insomnia    No known health problems     Patient Active Problem List   Diagnosis Date Noted   Migraine with aura and without status migrainosus, not intractable 07/28/2020   Insomnia 07/28/2020   Acne 12/13/2012    Past Surgical History:  Procedure Laterality Date   OTHER SURGICAL HISTORY     28 years old, repaired injury of L middle and ring finger     Home Medications    Prior to Admission medications   Medication Sig Start Date End Date Taking? Authorizing Provider  doxycycline (VIBRAMYCIN) 100 MG capsule Take 1 capsule (100 mg total) by mouth 2 (two) times daily for 7 days. 04/28/23 05/05/23 Yes Antavious Spanos, Lurena Joiner, PA-C  amitriptyline (ELAVIL) 25 MG tablet Take 1 tablet (25 mg total) by mouth at bedtime. 07/28/20   Sharlene Dory, DO  aspirin-acetaminophen-caffeine (EXCEDRIN MIGRAINE) [phone] MG tablet Take 1 tablet by mouth every 6 (six) hours as needed for headache. 01/19/21   Wallis Bamberg, PA-C  Melatonin 10 MG TABS Take 10 mg by mouth at bedtime. 06/05/20   Gailen Shelter, PA  prochlorperazine (COMPAZINE) 5 MG tablet Take 1 tablet (5 mg total) by mouth every 6 (six) hours as needed for nausea or vomiting. 06/18/20   Sharlene Dory, DO  SUMAtriptan (IMITREX) 100 MG tablet Take 1 tablet (100 mg total) by mouth every 2 (two) hours as needed for migraine. May repeat in 2 hours if headache persists or recurs. 07/28/20   Sharlene Dory, DO    Family History Family  History  Problem Relation Age of Onset   Lung cancer Maternal Grandfather    Colon cancer Other    Heart disease Neg Hx     Social History Social History   Tobacco Use   Smoking status: Every Day    Types: E-cigarettes   Smokeless tobacco: Never  Vaping Use   Vaping status: Every Day  Substance Use Topics   Alcohol use: No   Drug use: Not Currently    Types: Marijuana     Allergies   Patient has no known allergies.   Review of Systems Review of Systems Per HPI  Physical Exam Triage Vital Signs ED Triage Vitals [04/28/23 1021]  Encounter Vitals Group     BP 106/69     Systolic BP Percentile      Diastolic BP Percentile      Pulse Rate 70     Resp 16     Temp 98.4 F (36.9 C)     Temp Source Oral     SpO2 96 %     Weight      Height      Head Circumference      Peak Flow      Pain Score 0     Pain Loc      Pain Education      Exclude from Growth Chart    No data found.  Updated Vital Signs BP 106/69 (BP Location: Right Arm)   Pulse 70   Temp 98.4 F (36.9 C) (Oral)   Resp 16   SpO2 96%    Physical Exam Vitals and nursing note reviewed.  Constitutional:      General: He is not in acute distress.    Appearance: Normal appearance.  Cardiovascular:     Rate and Rhythm: Normal rate and regular rhythm.     Heart sounds: Normal heart sounds.  Pulmonary:     Effort: Pulmonary effort is normal.     Breath sounds: Normal breath sounds.  Neurological:     Mental Status: He is alert and oriented to person, place, and time.     UC Treatments / Results  Labs (all labs ordered are listed, but only abnormal results are displayed) Labs Reviewed  CYTOLOGY, (ORAL, ANAL, URETHRAL) ANCILLARY ONLY    EKG  Radiology No results found.  Procedures Procedures (including critical care time)  Medications Ordered in UC Medications - No data to display  Initial Impression / Assessment and Plan / UC Course  I have reviewed the triage vital signs and  the nursing notes.  Pertinent labs & imaging results that were available during my care of the patient were reviewed by me and considered in my medical decision making (see chart for details).  Cytology swab pending Treat positive result as indicated. For now will treat for chlamydia with doxycycline BID x 7 days. Will change treatment pending swab, patient understands.  Safe sex practices No questions at this time  Final Clinical Impressions(s) / UC Diagnoses   Final diagnoses:  Screen for STD (sexually transmitted disease)     Discharge Instructions      We will call you if anything on your swab returns positive. You can also see these results on MyChart. Please abstain from sexual intercourse until your results return.  I am treating you for chlamydia Start the doxycycline, twice daily always with food.  If by chance your swab comes back and it is not chlamydia, we will call you to please stop taking the antibiotic.     ED Prescriptions     Medication Sig Dispense Auth. Provider   doxycycline (VIBRAMYCIN) 100 MG capsule Take 1 capsule (100 mg total) by mouth 2 (two) times daily for 7 days. 14 capsule Dahmir Epperly, Lurena Joiner, PA-C      PDMP not reviewed this encounter.   Averi Cacioppo, Lurena Joiner, PA-C 04/28/23 1045

## 2023-04-29 ENCOUNTER — Telehealth (HOSPITAL_COMMUNITY): Payer: Self-pay

## 2023-04-29 LAB — CYTOLOGY, (ORAL, ANAL, URETHRAL) ANCILLARY ONLY
Chlamydia: POSITIVE — AB
Comment: NEGATIVE
Comment: NEGATIVE
Comment: NORMAL
Neisseria Gonorrhea: POSITIVE — AB
Trichomonas: NEGATIVE

## 2023-04-29 NOTE — Telephone Encounter (Signed)
 Per protocol, pt will need to return to [facility] for Nurse Visit for Rocephin 500mg  IM.  Attempted to reach patient x1. LVM.

## 2023-05-07 ENCOUNTER — Ambulatory Visit
Admission: EM | Admit: 2023-05-07 | Discharge: 2023-05-07 | Disposition: A | Discharge status: Home / Self Care | Attending: Nurse Practitioner | Admitting: Nurse Practitioner

## 2023-05-07 DIAGNOSIS — A549 Gonococcal infection, unspecified: Secondary | ICD-10-CM

## 2023-05-07 MED ORDER — CEFTRIAXONE SODIUM 500 MG IJ SOLR
500.0000 mg | Freq: Once | INTRAMUSCULAR | Status: AC
  Administered 2023-05-07: 500 mg via INTRAMUSCULAR

## 2023-05-07 NOTE — ED Triage Notes (Signed)
 Pt present for STD treatment.

## 2023-05-08 ENCOUNTER — Ambulatory Visit

## 2023-05-24 ENCOUNTER — Ambulatory Visit

## 2023-05-29 ENCOUNTER — Ambulatory Visit

## 2023-09-01 DIAGNOSIS — Q181 Preauricular sinus and cyst: Secondary | ICD-10-CM | POA: Diagnosis not present

## 2023-09-01 DIAGNOSIS — F439 Reaction to severe stress, unspecified: Secondary | ICD-10-CM | POA: Diagnosis not present

## 2023-09-01 DIAGNOSIS — G4700 Insomnia, unspecified: Secondary | ICD-10-CM | POA: Diagnosis not present

## 2023-09-01 DIAGNOSIS — G43B0 Ophthalmoplegic migraine, not intractable: Secondary | ICD-10-CM | POA: Diagnosis not present

## 2023-09-15 ENCOUNTER — Ambulatory Visit

## 2023-09-19 ENCOUNTER — Ambulatory Visit
Admission: RE | Admit: 2023-09-19 | Discharge: 2023-09-19 | Disposition: A | Discharge status: Home / Self Care | Source: Ambulatory Visit | Attending: Family Medicine | Admitting: Family Medicine

## 2023-09-19 VITALS — BP 117/72 | HR 75 | Temp 99.4°F | Resp 18

## 2023-09-19 DIAGNOSIS — L089 Local infection of the skin and subcutaneous tissue, unspecified: Secondary | ICD-10-CM | POA: Diagnosis not present

## 2023-09-19 DIAGNOSIS — Q181 Preauricular sinus and cyst: Secondary | ICD-10-CM

## 2023-09-19 DIAGNOSIS — L729 Follicular cyst of the skin and subcutaneous tissue, unspecified: Secondary | ICD-10-CM

## 2023-09-19 MED ORDER — AMOXICILLIN-POT CLAVULANATE 875-125 MG PO TABS: 1.0000 | ORAL_TABLET | Freq: Two times a day (BID) | ORAL | 0 refills | Status: AC

## 2023-09-19 NOTE — Discharge Instructions (Addendum)
 Start Augmentin  twice daily for 7 days.  Please follow-up with dermatology at your scheduled appointment next month for cyst removal once infection resolves.  Please go to the ER if you develop any worsening symptoms before seeing dermatology.  Please follow-up with your PCP if your symptoms do not improve.  Hope you feel better soon!

## 2023-09-19 NOTE — ED Triage Notes (Signed)
 Pt present with a cyst behind his rt ear x 2020. Pt states he cannot see dermatology until September. Pt denies pain to the touch. States it popped a few days ago and drained.

## 2023-09-19 NOTE — ED Provider Notes (Signed)
 [facility]    CSN: [id number] Arrival date & time: 09/19/23  1502      History   Chief Complaint Chief Complaint  Patient presents with   Abscess    I have an cyst I need cut open n removed dermatologist isn't seeing anyone til September - Entered by patient    HPI RAMESES OU is a 28 y.o. male with a past medical history of migraines and acne presents for possible earlobe cyst.  Patient reports she has had a cyst on his right earlobe since 2020.  He states last week it began swelling getting red and warm he did have 1 day of drainage.  He denies any fevers, chills or history of MRSA.  He has an appointment with dermatology in September to have the cyst removed.  No other concerns at this time.   Abscess   Past Medical History:  Diagnosis Date   Insomnia    No known health problems     Patient Active Problem List   Diagnosis Date Noted   Migraine with aura and without status migrainosus, not intractable 07/28/2020   Insomnia 07/28/2020   Acne 12/13/2012    Past Surgical History:  Procedure Laterality Date   OTHER SURGICAL HISTORY     28 years old, repaired injury of L middle and ring finger       Home Medications    Prior to Admission medications   Medication Sig Start Date End Date Taking? Authorizing Provider  amoxicillin -clavulanate (AUGMENTIN ) 875-125 MG tablet Take 1 tablet by mouth every 12 (twelve) hours. 09/19/23  Yes Teneil Shiller, Jodi R, NP  amitriptyline  (ELAVIL ) 25 MG tablet Take 1 tablet (25 mg total) by mouth at bedtime. 07/28/20   Frann Mabel Mt, DO  aspirin-acetaminophen -caffeine (EXCEDRIN  MIGRAINE) 250-250-65 MG tablet Take 1 tablet by mouth every 6 (six) hours as needed for headache. 01/19/21   Christopher Savannah, PA-C  Melatonin 10 MG TABS Take 10 mg by mouth at bedtime. 06/05/20   Neldon Hamp RAMAN, PA  prochlorperazine  (COMPAZINE ) 5 MG tablet Take 1 tablet (5 mg total) by mouth every 6 (six) hours as needed for nausea or vomiting. 06/18/20    Frann Mabel Mt, DO  SUMAtriptan  (IMITREX ) 100 MG tablet Take 1 tablet (100 mg total) by mouth every 2 (two) hours as needed for migraine. May repeat in 2 hours if headache persists or recurs. 07/28/20   Frann Mabel Mt, DO    Family History Family History  Problem Relation Age of Onset   Lung cancer Maternal Grandfather    Colon cancer Other    Heart disease Neg Hx     Social History Social History   Tobacco Use   Smoking status: Every Day    Types: E-cigarettes   Smokeless tobacco: Never  Vaping Use   Vaping status: Every Day  Substance Use Topics   Alcohol use: No   Drug use: Not Currently    Types: Marijuana     Allergies   Patient has no known allergies.   Review of Systems Review of Systems  Skin:        Infected cyst of your left     Physical Exam Triage Vital Signs ED Triage Vitals [09/19/23 1515]  Encounter Vitals Group     BP 117/72     Girls Systolic BP Percentile      Girls Diastolic BP Percentile      Boys Systolic BP Percentile      Boys Diastolic BP Percentile  Pulse Rate 75     Resp 18     Temp 99.4 F (37.4 C)     Temp Source Oral     SpO2 95 %     Weight      Height      Head Circumference      Peak Flow      Pain Score 2     Pain Loc      Pain Education      Exclude from Growth Chart    No data found.  Updated Vital Signs BP 117/72 (BP Location: Right Arm)   Pulse 75   Temp 99.4 F (37.4 C) (Oral)   Resp 18   SpO2 95%   Visual Acuity Right Eye Distance:   Left Eye Distance:   Bilateral Distance:    Right Eye Near:   Left Eye Near:    Bilateral Near:     Physical Exam Vitals and nursing note reviewed.  Constitutional:      General: He is not in acute distress.    Appearance: Normal appearance. He is not ill-appearing.  HENT:     Head: Normocephalic and atraumatic.     Ears:     Comments: There is a epidermoid cyst to the posterior aspect of the right earlobe.  There is a scab lesion to the  area without active drainage.  Mild erythema and warmth.  No fluctuance.  Area is mildly tender to palpation.  There is no swelling erythema warmth that extends into the cartilage of the ear or over mastoid area. Eyes:     Pupils: Pupils are equal, round, and reactive to light.  Cardiovascular:     Rate and Rhythm: Normal rate.  Pulmonary:     Effort: Pulmonary effort is normal.  Skin:    General: Skin is warm and dry.  Neurological:     General: No focal deficit present.     Mental Status: He is alert and oriented to person, place, and time.  Psychiatric:        Mood and Affect: Mood normal.        Behavior: Behavior normal.      UC Treatments / Results  Labs (all labs ordered are listed, but only abnormal results are displayed) Labs Reviewed - No data to display  EKG   Radiology No results found.  Procedures Procedures (including critical care time)  Medications Ordered in UC Medications - No data to display  Initial Impression / Assessment and Plan / UC Course  I have reviewed the triage vital signs and the nursing notes.  Pertinent labs & imaging results that were available during my care of the patient were reviewed by me and considered in my medical decision making (see chart for details).     Reviewed exam and symptoms with patient.  No red flags.  Will start Augmentin  to treat infection and have him follow-up with dermatology next month for cyst removal.  He was instructed to go to the ER for any worsening symptoms prior to seeing dermatology, red flags reviewed and he verbalized understanding.  PCP follow-up if symptoms do not improve. Final Clinical Impressions(s) / UC Diagnoses   Final diagnoses:  Cyst on ear  Infected cyst of skin     Discharge Instructions      Start Augmentin  twice daily for 7 days.  Please follow-up with dermatology at your scheduled appointment next month for cyst removal once infection resolves.  Please go to the ER if  you  develop any worsening symptoms before seeing dermatology.  Please follow-up with your PCP if your symptoms do not improve.  Hope you feel better soon!    ED Prescriptions     Medication Sig Dispense Auth. Provider   amoxicillin -clavulanate (AUGMENTIN ) 875-125 MG tablet Take 1 tablet by mouth every 12 (twelve) hours. 14 tablet Caedyn Tassinari, Jodi R, NP      PDMP not reviewed this encounter.   Loreda Myla SAUNDERS, NP 09/19/23 [phone]

## 2024-01-31 ENCOUNTER — Telehealth: Payer: Self-pay | Admitting: Family Medicine

## 2024-01-31 ENCOUNTER — Ambulatory Visit

## 2024-01-31 NOTE — Telephone Encounter (Signed)
 Called pt and left vm to call office back to reschedule missed appt

## 2024-03-12 ENCOUNTER — Ambulatory Visit: Payer: Self-pay

## 2024-03-27 ENCOUNTER — Ambulatory Visit
